# Patient Record
Sex: Male | Born: 1980 | Race: White | Hispanic: No | Marital: Single | Smoking: Heavy tobacco smoker
Health system: Southern US, Community
[De-identification: ages and names within clinical notes are randomized; demographics above are authoritative.]

## PROBLEM LIST (undated history)

## (undated) DIAGNOSIS — R011 Cardiac murmur, unspecified: Secondary | ICD-10-CM

## (undated) DIAGNOSIS — N200 Calculus of kidney: Secondary | ICD-10-CM

## (undated) HISTORY — DX: Calculus of kidney: N20.0

---

## 2004-12-07 ENCOUNTER — Emergency Department: Payer: Self-pay | Admitting: Emergency Medicine

## 2008-01-17 ENCOUNTER — Emergency Department: Payer: Self-pay | Admitting: Emergency Medicine

## 2009-01-29 ENCOUNTER — Emergency Department: Payer: Self-pay | Admitting: Emergency Medicine

## 2009-04-14 ENCOUNTER — Emergency Department: Payer: Self-pay | Admitting: Unknown Physician Specialty

## 2009-10-09 ENCOUNTER — Emergency Department: Payer: Self-pay | Admitting: Emergency Medicine

## 2010-07-18 ENCOUNTER — Emergency Department (HOSPITAL_COMMUNITY)
Admission: EM | Admit: 2010-07-18 | Discharge: 2010-07-18 | Payer: Self-pay | Source: Home / Self Care | Admitting: Emergency Medicine

## 2010-08-16 ENCOUNTER — Emergency Department: Payer: Self-pay | Admitting: Emergency Medicine

## 2010-08-17 ENCOUNTER — Emergency Department (HOSPITAL_COMMUNITY)
Admission: EM | Admit: 2010-08-17 | Discharge: 2010-08-17 | Disposition: A | Discharge status: Home / Self Care | Payer: Self-pay | Attending: Emergency Medicine | Admitting: Emergency Medicine

## 2010-08-17 DIAGNOSIS — K029 Dental caries, unspecified: Secondary | ICD-10-CM | POA: Insufficient documentation

## 2010-08-17 DIAGNOSIS — F17200 Nicotine dependence, unspecified, uncomplicated: Secondary | ICD-10-CM | POA: Insufficient documentation

## 2010-09-28 ENCOUNTER — Emergency Department (HOSPITAL_COMMUNITY)
Admission: EM | Admit: 2010-09-28 | Discharge: 2010-09-29 | Disposition: A | Discharge status: Home / Self Care | Payer: Self-pay | Attending: Emergency Medicine | Admitting: Emergency Medicine

## 2010-09-28 DIAGNOSIS — K089 Disorder of teeth and supporting structures, unspecified: Secondary | ICD-10-CM | POA: Insufficient documentation

## 2010-09-28 DIAGNOSIS — X58XXXA Exposure to other specified factors, initial encounter: Secondary | ICD-10-CM | POA: Insufficient documentation

## 2010-09-28 DIAGNOSIS — K047 Periapical abscess without sinus: Secondary | ICD-10-CM | POA: Insufficient documentation

## 2010-09-28 DIAGNOSIS — S025XXA Fracture of tooth (traumatic), initial encounter for closed fracture: Secondary | ICD-10-CM | POA: Insufficient documentation

## 2010-10-29 ENCOUNTER — Emergency Department (HOSPITAL_COMMUNITY)
Admission: EM | Admit: 2010-10-29 | Discharge: 2010-10-29 | Disposition: A | Discharge status: Home / Self Care | Payer: Self-pay | Attending: Emergency Medicine | Admitting: Emergency Medicine

## 2010-10-29 ENCOUNTER — Emergency Department (HOSPITAL_COMMUNITY): Payer: Self-pay

## 2010-10-29 DIAGNOSIS — M25519 Pain in unspecified shoulder: Secondary | ICD-10-CM | POA: Insufficient documentation

## 2010-10-29 DIAGNOSIS — IMO0002 Reserved for concepts with insufficient information to code with codable children: Secondary | ICD-10-CM | POA: Insufficient documentation

## 2010-10-29 DIAGNOSIS — S46909A Unspecified injury of unspecified muscle, fascia and tendon at shoulder and upper arm level, unspecified arm, initial encounter: Secondary | ICD-10-CM | POA: Insufficient documentation

## 2010-10-29 DIAGNOSIS — M25619 Stiffness of unspecified shoulder, not elsewhere classified: Secondary | ICD-10-CM | POA: Insufficient documentation

## 2010-10-29 DIAGNOSIS — W11XXXA Fall on and from ladder, initial encounter: Secondary | ICD-10-CM | POA: Insufficient documentation

## 2010-10-29 DIAGNOSIS — M545 Low back pain, unspecified: Secondary | ICD-10-CM | POA: Insufficient documentation

## 2010-10-29 DIAGNOSIS — S4980XA Other specified injuries of shoulder and upper arm, unspecified arm, initial encounter: Secondary | ICD-10-CM | POA: Insufficient documentation

## 2011-02-27 ENCOUNTER — Emergency Department: Payer: Self-pay | Admitting: Emergency Medicine

## 2011-03-31 ENCOUNTER — Emergency Department: Payer: Self-pay | Admitting: Emergency Medicine

## 2011-04-02 ENCOUNTER — Emergency Department: Payer: Self-pay | Admitting: *Deleted

## 2011-04-17 ENCOUNTER — Emergency Department: Payer: Self-pay | Admitting: Emergency Medicine

## 2011-04-24 ENCOUNTER — Emergency Department (HOSPITAL_COMMUNITY)
Admission: EM | Admit: 2011-04-24 | Discharge: 2011-04-24 | Disposition: A | Discharge status: Home / Self Care | Payer: Self-pay | Attending: Emergency Medicine | Admitting: Emergency Medicine

## 2011-04-24 DIAGNOSIS — K047 Periapical abscess without sinus: Secondary | ICD-10-CM | POA: Insufficient documentation

## 2011-04-24 DIAGNOSIS — K089 Disorder of teeth and supporting structures, unspecified: Secondary | ICD-10-CM | POA: Insufficient documentation

## 2011-04-24 DIAGNOSIS — R221 Localized swelling, mass and lump, neck: Secondary | ICD-10-CM | POA: Insufficient documentation

## 2011-04-24 DIAGNOSIS — R220 Localized swelling, mass and lump, head: Secondary | ICD-10-CM | POA: Insufficient documentation

## 2011-04-24 DIAGNOSIS — R51 Headache: Secondary | ICD-10-CM | POA: Insufficient documentation

## 2011-04-24 DIAGNOSIS — K0381 Cracked tooth: Secondary | ICD-10-CM | POA: Insufficient documentation

## 2012-04-22 ENCOUNTER — Emergency Department: Payer: Self-pay | Admitting: Emergency Medicine

## 2012-07-05 ENCOUNTER — Emergency Department (HOSPITAL_COMMUNITY)
Admission: EM | Admit: 2012-07-05 | Discharge: 2012-07-05 | Disposition: A | Discharge status: Home / Self Care | Payer: Self-pay | Attending: Emergency Medicine | Admitting: Emergency Medicine

## 2012-07-05 ENCOUNTER — Encounter (HOSPITAL_COMMUNITY): Payer: Self-pay | Admitting: Emergency Medicine

## 2012-07-05 DIAGNOSIS — S025XXA Fracture of tooth (traumatic), initial encounter for closed fracture: Secondary | ICD-10-CM | POA: Insufficient documentation

## 2012-07-05 DIAGNOSIS — Y929 Unspecified place or not applicable: Secondary | ICD-10-CM | POA: Insufficient documentation

## 2012-07-05 DIAGNOSIS — X58XXXA Exposure to other specified factors, initial encounter: Secondary | ICD-10-CM | POA: Insufficient documentation

## 2012-07-05 DIAGNOSIS — F17200 Nicotine dependence, unspecified, uncomplicated: Secondary | ICD-10-CM | POA: Insufficient documentation

## 2012-07-05 DIAGNOSIS — Y9389 Activity, other specified: Secondary | ICD-10-CM | POA: Insufficient documentation

## 2012-07-05 MED ORDER — AMOXICILLIN 500 MG PO CAPS
1000.0000 mg | ORAL_CAPSULE | Freq: Once | ORAL | Status: AC
  Administered 2012-07-05: 1000 mg via ORAL
  Filled 2012-07-05: qty 1

## 2012-07-05 MED ORDER — OXYCODONE-ACETAMINOPHEN 5-325 MG PO TABS
2.0000 | ORAL_TABLET | Freq: Once | ORAL | Status: AC
  Administered 2012-07-05: 2 via ORAL
  Filled 2012-07-05: qty 2

## 2012-07-05 MED ORDER — AMOXICILLIN 500 MG PO CAPS: 500.0000 mg | ORAL_CAPSULE | Freq: Three times a day (TID) | ORAL | Status: DC

## 2012-07-05 MED ORDER — OXYCODONE-ACETAMINOPHEN 5-325 MG PO TABS: 2.0000 | ORAL_TABLET | ORAL | Status: DC | PRN

## 2012-07-05 NOTE — ED Notes (Signed)
PT. REPORTS LEFT LOWER MOLAR PAIN ONSET YESTERDAY UNRELIEVED BY OTC TYLENOL / IBUPROFEN.

## 2012-07-05 NOTE — ED Provider Notes (Signed)
History   This chart was scribed for Donnetta Hutching, MD by Sofie Rower, ED Scribe. The patient was seen in room TR08C/TR08C and the patient's care was started at 8:03PM.    CSN: [id number]  Arrival date & time 07/05/12  4098   First MD Initiated Contact with Patient 07/05/12 2003      Chief Complaint  Patient presents with  . Dental Pain    (Consider location/radiation/quality/duration/timing/severity/associated sxs/prior treatment) The history is provided by the patient. No language interpreter was used.    Patrick Higgins is a 31 y.o. male , who presents to the Emergency Department complaining of sudden, progressively worsening, dental pain located at the lower left jaw, onset yesterday (07/04/12). The pt reports he was chewing some food yesterday, 07/04/12, where he believes a piece of one of his lower left molars chipped off. The pt has not taken any medications to relieve his dental pain at present.   The pt is a heavy tobacco smoker, however, he does not drink alcohol.   History reviewed. No pertinent past medical history.  History reviewed. No pertinent past surgical history.  No family history on file.  History  Substance Use Topics  . Smoking status: Heavy Tobacco Smoker  . Smokeless tobacco: Not on file  . Alcohol Use: No      Review of Systems  HENT: Positive for dental problem.   All other systems reviewed and are negative.    Allergies  Review of patient's allergies indicates no known allergies.  Home Medications  No current outpatient prescriptions on file.  BP 144/2  Pulse 95  Temp 98.4 F (36.9 C) (Oral)  Resp 14  SpO2 100%  Physical Exam  Nursing note and vitals reviewed. Constitutional: He is oriented to person, place, and time. He appears well-developed and well-nourished.  HENT:  Head: Atraumatic.  Nose: Nose normal.       Left lower premolar: fractured tooth present.   Musculoskeletal: Normal range of motion.  Neurological: He is alert  and oriented to person, place, and time.  Skin: Skin is warm and dry.  Psychiatric: He has a normal mood and affect.    ED Course  Procedures (including critical care time)  DIAGNOSTIC STUDIES: Oxygen Saturation is 100% on room air, normal by my interpretation.    COORDINATION OF CARE:   8:24 PM- Treatment plan discussed with patient. Pt agrees with treatment.     Labs Reviewed - No data to display No results found.   No diagnosis found.    MDM   Rx amoxicillin and Percocet. Referral to Dentist     I personally performed the services described in this documentation, which was scribed in my presence. The recorded information has been reviewed and is accurate.    Donnetta Hutching, MD 07/05/12 2043

## 2012-08-03 ENCOUNTER — Emergency Department (HOSPITAL_COMMUNITY)
Admission: EM | Admit: 2012-08-03 | Discharge: 2012-08-03 | Disposition: A | Discharge status: Home / Self Care | Payer: Self-pay | Attending: Emergency Medicine | Admitting: Emergency Medicine

## 2012-08-03 ENCOUNTER — Encounter (HOSPITAL_COMMUNITY): Payer: Self-pay | Admitting: Cardiology

## 2012-08-03 DIAGNOSIS — K029 Dental caries, unspecified: Secondary | ICD-10-CM | POA: Insufficient documentation

## 2012-08-03 DIAGNOSIS — K089 Disorder of teeth and supporting structures, unspecified: Secondary | ICD-10-CM | POA: Insufficient documentation

## 2012-08-03 DIAGNOSIS — F17200 Nicotine dependence, unspecified, uncomplicated: Secondary | ICD-10-CM | POA: Insufficient documentation

## 2012-08-03 MED ORDER — PENICILLIN V POTASSIUM 500 MG PO TABS: 500.0000 mg | ORAL_TABLET | Freq: Three times a day (TID) | ORAL | Status: DC

## 2012-08-03 MED ORDER — OXYCODONE-ACETAMINOPHEN 5-325 MG PO TABS: 1.0000 | ORAL_TABLET | Freq: Four times a day (QID) | ORAL | Status: DC | PRN

## 2012-08-03 NOTE — ED Provider Notes (Signed)
Medical screening examination/treatment/procedure(s) were performed by non-physician practitioner and as supervising physician I was immediately available for consultation/collaboration.   Christopher J. Pollina, MD 08/03/12 2314 

## 2012-08-03 NOTE — ED Provider Notes (Signed)
History   This chart was scribed for non-physician practitioner working with Patrick Higgins,  by Gerlean Ren, ED Scribe. This patient was seen in room TR05C/TR05C and the patient's care was started at 4:48 PM.    CSN: [id number]  Arrival date & time 08/03/12  1452   None     Chief Complaint  Patient presents with  . Dental Pain     The history is provided by the patient. No language interpreter was used.   Patrick Higgins is a 32 y.o. male who presents to the Emergency Department complaining of constant, gradually worsening, sharp, moderate right side lower dental pain radiating into right mandible with gradual onset 2 days ago.  Pt denies any trauma associated with pain.  Pt states he has an appointment with dentist Dr. Thayer Ohm 02/25 in [place].  Pt states he was on amoxicillin 2 weeks ago for a chipped tooth on the left side but that he has completed the antibiotic regiment.  Pt is a current everyday smoker but denies alcohol use.  History reviewed. No pertinent past medical history.  History reviewed. No pertinent past surgical history.  History reviewed. No pertinent family history.  History  Substance Use Topics  . Smoking status: Heavy Tobacco Smoker  . Smokeless tobacco: Not on file  . Alcohol Use: No      Review of Systems  HENT: Positive for dental problem.   All other systems reviewed and are negative.    Allergies  Review of patient's allergies indicates no known allergies.  Home Medications   Current Outpatient Rx  Name  Route  Sig  Dispense  Refill  . IBUPROFEN 200 MG PO TABS   Oral   Take 400 mg by mouth every 2 (two) hours as needed. For pain           BP 133/78  Pulse 79  Temp 97 F (36.1 C) (Oral)  Resp 16  SpO2 97%  Physical Exam  Nursing note and vitals reviewed. Constitutional: He is oriented to person, place, and time. He appears well-developed and well-nourished. No distress.  HENT:  Head: Normocephalic and atraumatic.     Widespread dental decay with a carrie on tooth #30.  Swelling in right mandible  Eyes: EOM are normal.  Neck: Neck supple. No tracheal deviation present.  Cardiovascular: Normal rate, regular rhythm and normal heart sounds.   No murmur heard. Pulmonary/Chest: Effort normal and breath sounds normal. No respiratory distress. He has no wheezes.  Musculoskeletal: Normal range of motion.  Lymphadenopathy:    He has no cervical adenopathy.  Neurological: He is alert and oriented to person, place, and time.  Skin: Skin is warm and dry.  Psychiatric: He has a normal mood and affect. His behavior is normal.    ED Course  Procedures (including critical care time) DIAGNOSTIC STUDIES: Oxygen Saturation is 97% on room air, adequate by my interpretation.    COORDINATION OF CARE: 4:51 PM- Discussed with pt that he needs to keep his dental appointment.  Discussed antibiotics, pain medication, and discharge.  Pt understands and agrees with plan.  No diagnosis found.  Dental pain/caries.  Patient states he has an appointment with a dentist in [place] next month.  MDM     I personally performed the services described in this documentation, which was scribed in my presence. The recorded information has been reviewed and is accurate.       Jimmye Norman, NP 08/03/12 1751

## 2012-08-03 NOTE — ED Notes (Signed)
Pt reports pain on the left lower jaw pain and is now having pain on the right side. States that he has a appt on the 25th but is having increased pain now.

## 2013-09-15 ENCOUNTER — Emergency Department: Payer: Self-pay | Admitting: Emergency Medicine

## 2013-11-19 ENCOUNTER — Emergency Department: Payer: Self-pay | Admitting: Emergency Medicine

## 2013-11-23 LAB — WOUND CULTURE

## 2014-05-14 ENCOUNTER — Emergency Department: Payer: Self-pay | Admitting: Emergency Medicine

## 2014-05-14 LAB — URINALYSIS, COMPLETE
BACTERIA: NONE SEEN
BILIRUBIN, UR: NEGATIVE
BLOOD: NEGATIVE
GLUCOSE, UR: NEGATIVE mg/dL (ref 0–75)
Ketone: NEGATIVE
LEUKOCYTE ESTERASE: NEGATIVE
Nitrite: NEGATIVE
Ph: 7 (ref 4.5–8.0)
Protein: NEGATIVE
SPECIFIC GRAVITY: 1.015 (ref 1.003–1.030)
SQUAMOUS EPITHELIAL: NONE SEEN
WBC UR: 15 /HPF (ref 0–5)

## 2014-05-14 LAB — COMPREHENSIVE METABOLIC PANEL
ALK PHOS: 104 U/L
ALT: 20 U/L
ANION GAP: 5 — AB (ref 7–16)
Albumin: 3.8 g/dL (ref 3.4–5.0)
BUN: 12 mg/dL (ref 7–18)
Bilirubin,Total: 0.4 mg/dL (ref 0.2–1.0)
CALCIUM: 8.4 mg/dL — AB (ref 8.5–10.1)
CO2: 29 mmol/L (ref 21–32)
Chloride: 106 mmol/L (ref 98–107)
Creatinine: 0.92 mg/dL (ref 0.60–1.30)
GLUCOSE: 86 mg/dL (ref 65–99)
Osmolality: 278 (ref 275–301)
POTASSIUM: 3.7 mmol/L (ref 3.5–5.1)
SGOT(AST): 16 U/L (ref 15–37)
SODIUM: 140 mmol/L (ref 136–145)
TOTAL PROTEIN: 7.5 g/dL (ref 6.4–8.2)

## 2014-05-14 LAB — CBC
HCT: 42 % (ref 40.0–52.0)
HGB: 14.3 g/dL (ref 13.0–18.0)
MCH: 27.2 pg (ref 26.0–34.0)
MCHC: 33.9 g/dL (ref 32.0–36.0)
MCV: 80 fL (ref 80–100)
Platelet: 192 10*3/uL (ref 150–440)
RBC: 5.24 10*6/uL (ref 4.40–5.90)
RDW: 12.8 % (ref 11.5–14.5)
WBC: 6.7 10*3/uL (ref 3.8–10.6)

## 2014-05-20 ENCOUNTER — Emergency Department: Payer: Self-pay | Admitting: Emergency Medicine

## 2014-08-29 ENCOUNTER — Emergency Department: Payer: Self-pay | Admitting: Student

## 2015-02-19 ENCOUNTER — Emergency Department: Payer: Self-pay

## 2015-02-19 ENCOUNTER — Encounter: Payer: Self-pay | Admitting: Emergency Medicine

## 2015-02-19 ENCOUNTER — Emergency Department
Admission: EM | Admit: 2015-02-19 | Discharge: 2015-02-19 | Disposition: A | Discharge status: Home / Self Care | Payer: Self-pay | Attending: Emergency Medicine | Admitting: Emergency Medicine

## 2015-02-19 DIAGNOSIS — Y9289 Other specified places as the place of occurrence of the external cause: Secondary | ICD-10-CM | POA: Insufficient documentation

## 2015-02-19 DIAGNOSIS — Y998 Other external cause status: Secondary | ICD-10-CM | POA: Insufficient documentation

## 2015-02-19 DIAGNOSIS — Y288XXA Contact with other sharp object, undetermined intent, initial encounter: Secondary | ICD-10-CM | POA: Insufficient documentation

## 2015-02-19 DIAGNOSIS — S61412A Laceration without foreign body of left hand, initial encounter: Secondary | ICD-10-CM

## 2015-02-19 DIAGNOSIS — Z720 Tobacco use: Secondary | ICD-10-CM | POA: Insufficient documentation

## 2015-02-19 DIAGNOSIS — Y9389 Activity, other specified: Secondary | ICD-10-CM | POA: Insufficient documentation

## 2015-02-19 DIAGNOSIS — S66922A Laceration of unspecified muscle, fascia and tendon at wrist and hand level, left hand, initial encounter: Secondary | ICD-10-CM | POA: Insufficient documentation

## 2015-02-19 DIAGNOSIS — Z792 Long term (current) use of antibiotics: Secondary | ICD-10-CM | POA: Insufficient documentation

## 2015-02-19 MED ORDER — OXYCODONE-ACETAMINOPHEN 7.5-325 MG PO TABS: 1.0000 | ORAL_TABLET | Freq: Four times a day (QID) | ORAL | Status: DC | PRN

## 2015-02-19 MED ORDER — BACITRACIN ZINC 500 UNIT/GM EX OINT
TOPICAL_OINTMENT | CUTANEOUS | Status: AC
  Filled 2015-02-19: qty 0.9

## 2015-02-19 MED ORDER — SULFAMETHOXAZOLE-TRIMETHOPRIM 800-160 MG PO TABS: 1.0000 | ORAL_TABLET | Freq: Two times a day (BID) | ORAL | Status: DC

## 2015-02-19 MED ORDER — LIDOCAINE HCL (PF) 1 % IJ SOLN
5.0000 mL | Freq: Once | INTRAMUSCULAR | Status: DC
Start: 2015-02-19 — End: 2015-02-19

## 2015-02-19 MED ORDER — LIDOCAINE HCL (PF) 1 % IJ SOLN
INTRAMUSCULAR | Status: AC
  Filled 2015-02-19: qty 5

## 2015-02-19 NOTE — ED Notes (Addendum)
Pt accidentally  Cut left hand hand with a chain saw.  2 cm laceration, to posterior hand, bleeding controlled, dressing applied in triage.  Small tendons visible thru laceration.  Pt able to move all fingers w/o difficulty.

## 2015-02-19 NOTE — ED Notes (Signed)
Neosporin dsy to left hand prio to splint

## 2015-02-19 NOTE — ED Provider Notes (Signed)
[facility] Emergency Department Provider Note  ____________________________________________  Time seen: Approximately 3:23 PM  I have reviewed the triage vital signs and the nursing notes.   HISTORY  Chief Complaint Laceration    HPI Patrick Higgins is a 34 y.o. male patient for laceration dorsal aspect of the third metacarpal. Injury was caused by a chain saw. Patient has a visible exposed tendon with incomplete disruption. Patient able to flex and extend the third digit. Patient denies any loss of sensation or function. Patient rates his pain as a 9/10 and described as sharp. Except for direct pressure controlled the hemorrhage and no other palliative measures performed prior to arrival. Patient state his tetanus shot is up-to-date. Patient is right-hand dominant. History reviewed. No pertinent past medical history.  There are no active problems to display for this patient.   No past surgical history on file.  Current Outpatient Rx  Name  Route  Sig  Dispense  Refill  . ibuprofen (ADVIL,MOTRIN) 200 MG tablet   Oral   Take 400 mg by mouth every 2 (two) hours as needed. For pain         . oxyCODONE-acetaminophen (PERCOCET) 7.5-325 MG per tablet   Oral   Take 1 tablet by mouth every 6 (six) hours as needed for severe pain.   12 tablet   0   . oxyCODONE-acetaminophen (PERCOCET/ROXICET) 5-325 MG per tablet   Oral   Take 1 tablet by mouth every 6 (six) hours as needed for pain.   15 tablet   0   . penicillin v potassium (VEETID) 500 MG tablet   Oral   Take 1 tablet (500 mg total) by mouth 3 (three) times daily.   30 tablet   0   . sulfamethoxazole-trimethoprim (BACTRIM DS,SEPTRA DS) 800-160 MG per tablet   Oral   Take 1 tablet by mouth 2 (two) times daily.   20 tablet   0     Allergies Hydrocodone  No family history on file.  Social History Social History  Substance Use Topics  . Smoking status: Heavy Tobacco Smoker    Types:  Cigarettes  . Smokeless tobacco: None  . Alcohol Use: No    Review of Systems Constitutional: No fever/chills Eyes: No visual changes. ENT: No sore throat. Cardiovascular: Denies chest pain. Respiratory: Denies shortness of breath. Gastrointestinal: No abdominal pain.  No nausea, no vomiting.  No diarrhea.  No constipation. Genitourinary: Negative for dysuria. Musculoskeletal: Laceration dorsal aspect the left hand. Skin: Negative for rash. Neurological: Negative for headaches, focal weakness or numbness. 10-point ROS otherwise negative.  ____________________________________________   PHYSICAL EXAM:  VITAL SIGNS: ED Triage Vitals  Enc Vitals Group     BP 02/19/15 1414 134/79 mmHg     Pulse Rate 02/19/15 1414 58     Resp 02/19/15 1414 16     Temp 02/19/15 1414 98.1 F (36.7 C)     Temp Source 02/19/15 1414 Oral     SpO2 02/19/15 1414 100 %     Weight 02/19/15 1414 190 lb (86.183 kg)     Height 02/19/15 1414 6\' 3"  (1.905 m)     Head Cir --      Peak Flow --      Pain Score 02/19/15 1415 9     Pain Loc --      Pain Edu? --      Excl. in GC? --     Constitutional: Alert and oriented. Well appearing and in no  acute distress. Eyes: Conjunctivae are normal. PERRL. EOMI. Head: Atraumatic. Nose: No congestion/rhinnorhea. Mouth/Throat: Mucous membranes are moist.  Oropharynx non-erythematous. Neck: No stridor.  No cervical spine tenderness to palpation. Hematological/Lymphatic/Immunilogical: No cervical lymphadenopathy. Cardiovascular: Normal rate, regular rhythm. Grossly normal heart sounds.  Good peripheral circulation. Respiratory: Normal respiratory effort.  No retractions. Lungs CTAB. Gastrointestinal: Soft and nontender. No distention. No abdominal bruits. No CVA tenderness. Musculoskeletal: 2 cm laceration to the dorsal aspect of the left hand over the third metacarpal. Tendon exposed and has a laceration but not complete disruption of the tendon. Patient has full  and equal range of motion at this time. States sensation is intact. Neurologic:  Normal speech and language. No gross focal neurologic deficits are appreciated. No gait instability. Skin:  2 cm laceration dorsal aspect left hand  Psychiatric: Mood and affect are normal. Speech and behavior are normal.  ____________________________________________   LABS (all labs ordered are listed, but only abnormal results are displayed)  Labs Reviewed - No data to display ____________________________________________  EKG   ____________________________________________  RADIOLOGY  X-rays grossly unremarkable.I, Joni Reining, personally viewed and evaluated these images (plain radiographs) as part of my medical decision making.   ____________________________________________   PROCEDURES  Procedure(s) performed see procedure note  Critical Care performed: No  LACERATION REPAIR Performed by: Joni Reining Authorized by: Joni Reining Consent: Verbal consent obtained. Risks and benefits: risks, benefits and alternatives were discussed Consent given by: patient Patient identity confirmed: provided demographic data Prepped and Draped in normal sterile fashion Wound explored  Laceration Location: Dose aspect the left hand at the third metacarpal  Laceration Length: 2 cm  No Foreign Bodies seen or palpated  Anesthesia: local infiltration  Local anesthetic: 1% lidocaine with epinephrine.   Anesthetic total: 4 mL.   Irrigation method: syringe Amount of cleaning: standard  Skin closure: 4-0 nylon.   Number of sutures: 6   Technique: Interrupted   Patient tolerance: Patient tolerated the procedure well with no immediate complications. _____________________________________   INITIAL IMPRESSION / ASSESSMENT AND PLAN / ED COURSE  Pertinent labs & imaging results that were available during my care of the patient were reviewed by me and considered in my medical decision making  (see chart for details).  Laceration dorsal aspect the left hand at the third medical carpal. There is a partial laceration of the tendon. There is no complete disruption of the tendon. Discussed findings with the on-call orthopedic Dr. Joice Lofts and advised copious irrigation closure and splint. Patient advised on wound care. Patient advised return ER if loss of function of the affected finger. Patient discharge with Bactrim and Percocets. Patient returned back in 10 days for suture removal.  FINAL CLINICAL IMPRESSION(S) / ED DIAGNOSES  Final diagnoses:  Laceration of left hand involving tendon, initial encounter      Joni Reining, PA-C 02/19/15 1610  Jene Every, MD 02/20/15 1409

## 2015-02-19 NOTE — Discharge Instructions (Signed)
Return to ER if loss of function of 3rd digit left hand.  Wear splint for 5-7 days.

## 2015-02-19 NOTE — ED Notes (Signed)
Pt reports got cut by chainsaw to top of left hand, no bleeding at this time. Clean bandage applied, strong pulse present.

## 2015-03-01 ENCOUNTER — Encounter: Payer: Self-pay | Admitting: Emergency Medicine

## 2015-03-01 ENCOUNTER — Emergency Department
Admission: EM | Admit: 2015-03-01 | Discharge: 2015-03-01 | Disposition: A | Discharge status: Home / Self Care | Payer: Self-pay | Attending: Emergency Medicine | Admitting: Emergency Medicine

## 2015-03-01 DIAGNOSIS — L089 Local infection of the skin and subcutaneous tissue, unspecified: Secondary | ICD-10-CM | POA: Insufficient documentation

## 2015-03-01 DIAGNOSIS — W132XXD Fall from, out of or through roof, subsequent encounter: Secondary | ICD-10-CM | POA: Insufficient documentation

## 2015-03-01 DIAGNOSIS — S61412D Laceration without foreign body of left hand, subsequent encounter: Secondary | ICD-10-CM | POA: Insufficient documentation

## 2015-03-01 DIAGNOSIS — Z720 Tobacco use: Secondary | ICD-10-CM | POA: Insufficient documentation

## 2015-03-01 LAB — CBC WITH DIFFERENTIAL/PLATELET
BASOS ABS: 0 10*3/uL (ref 0–0.1)
BASOS PCT: 0 %
EOS PCT: 1 %
Eosinophils Absolute: 0.1 10*3/uL (ref 0–0.7)
HEMATOCRIT: 38.2 % — AB (ref 40.0–52.0)
Hemoglobin: 12.9 g/dL — ABNORMAL LOW (ref 13.0–18.0)
LYMPHS PCT: 14 %
Lymphs Abs: 1.2 10*3/uL (ref 1.0–3.6)
MCH: 26.5 pg (ref 26.0–34.0)
MCHC: 33.9 g/dL (ref 32.0–36.0)
MCV: 78.4 fL — ABNORMAL LOW (ref 80.0–100.0)
MONO ABS: 0.3 10*3/uL (ref 0.2–1.0)
Monocytes Relative: 4 %
NEUTROS ABS: 6.8 10*3/uL — AB (ref 1.4–6.5)
Neutrophils Relative %: 81 %
PLATELETS: 164 10*3/uL (ref 150–440)
RBC: 4.87 MIL/uL (ref 4.40–5.90)
RDW: 12.6 % (ref 11.5–14.5)
WBC: 8.5 10*3/uL (ref 3.8–10.6)

## 2015-03-01 MED ORDER — OXYCODONE-ACETAMINOPHEN 5-325 MG PO TABS
1.0000 | ORAL_TABLET | Freq: Once | ORAL | Status: AC
  Administered 2015-03-01: 1 via ORAL
  Filled 2015-03-01: qty 1

## 2015-03-01 MED ORDER — OXYCODONE-ACETAMINOPHEN 5-325 MG PO TABS: 1.0000 | ORAL_TABLET | ORAL | Status: DC | PRN

## 2015-03-01 MED ORDER — CEPHALEXIN 500 MG PO CAPS: 500.0000 mg | ORAL_CAPSULE | Freq: Four times a day (QID) | ORAL | Status: DC

## 2015-03-01 NOTE — ED Provider Notes (Signed)
[facility] Emergency Department Provider Note  ____________________________________________  Time seen: Approximately 1:57 PM  I have reviewed the triage vital signs and the nursing notes.   HISTORY  Chief Complaint Hand Pain  HPI Patrick Higgins is a 34 y.o. male is here today with complaint of left hand swollen and hurting. He was seen in the emergency room 8/15 after a laceration with a chain saw to his left hand. Patient states until now he has had no difficulty with his hand. He denies any fever or chills. He denies seeing any drainage from his hand. He states the redness to the dorsum of his left hand has not been there prior to today.Currently he rates his pain an 8 out of 10.   History reviewed. No pertinent past medical history.  There are no active problems to display for this patient.   History reviewed. No pertinent past surgical history.  Current Outpatient Rx  Name  Route  Sig  Dispense  Refill  . cephALEXin (KEFLEX) 500 MG capsule   Oral   Take 1 capsule (500 mg total) by mouth 4 (four) times daily.   28 capsule   0   . ibuprofen (ADVIL,MOTRIN) 200 MG tablet   Oral   Take 400 mg by mouth every 2 (two) hours as needed. For pain         . oxyCODONE-acetaminophen (PERCOCET) 5-325 MG per tablet   Oral   Take 1 tablet by mouth every 4 (four) hours as needed for severe pain.   20 tablet   0     Allergies Hydrocodone  No family history on file.  Social History Social History  Substance Use Topics  . Smoking status: Heavy Tobacco Smoker -- 1.00 packs/day    Types: Cigarettes  . Smokeless tobacco: None  . Alcohol Use: No    Review of Systems Constitutional: No fever/chills Cardiovascular: Denies chest pain. Respiratory: Denies shortness of breath. Gastrointestinal:  No nausea, no vomiting.  Musculoskeletal: Negative for back pain. Skin: Negative for rash. Positive for erythema left hand Neurological: Negative for  headaches, focal weakness or numbness.  10-point ROS otherwise negative.  ____________________________________________   PHYSICAL EXAM:  VITAL SIGNS: ED Triage Vitals  Enc Vitals Group     BP 03/01/15 1324 139/70 mmHg     Pulse Rate 03/01/15 1324 64     Resp 03/01/15 1324 18     Temp 03/01/15 1324 98.2 F (36.8 C)     Temp Source 03/01/15 1324 Oral     SpO2 03/01/15 1324 98 %     Weight 03/01/15 1321 190 lb (86.183 kg)     Height 03/01/15 1321  (1.854 m)     Head Cir --      Peak Flow --      Pain Score 03/01/15 1322 8     Pain Loc --      Pain Edu? --      Excl. in GC? --     Constitutional: Alert and oriented. Well appearing and in no acute distress. Eyes: Conjunctivae are normal. PERRL. EOMI. Head: Atraumatic. Nose: No congestion/rhinnorhea. Neck: No stridor.   Respiratory: Normal respiratory effort.  No retractions. Gastrointestinal: . No distention. Musculoskeletal left hand range of motion without restriction. Neurologic:  Normal speech and language. No gross focal neurologic deficits are appreciated. No gait instability. Skin:  Skin is warm, dry.  Dorsum of left hand has been sutured. Area appears to be somewhat red and moderately tender  on touch. There is no drainage noted at this time and sutures are in place. Psychiatric: Mood and affect are normal. Speech and behavior are normal.  ____________________________________________   LABS (all labs ordered are listed, but only abnormal results are displayed)  Labs Reviewed  CBC WITH DIFFERENTIAL/PLATELET - Abnormal; Notable for the following:    Hemoglobin 12.9 (*)    HCT 38.2 (*)    MCV 78.4 (*)    Neutro Abs 6.8 (*)    All other components within normal limits    PROCEDURES  Procedure(s) performed: None  Critical Care performed: No  ____________________________________________   INITIAL IMPRESSION / ASSESSMENT AND PLAN / ED COURSE  Pertinent labs & imaging results that were available during  my care of the patient were reviewed by me and considered in my medical decision making (see chart for details).  White count is within normal limits. Patient was placed on Keflex 500 mg 4 times a day for 7 days and Percocet as needed for pain. Patient will return to the emergency room in 3 days for removal of the sutures. He is return to the emergency room sooner if any fever, chills, drainage or severe worsening of his hand infection. ____________________________________________   FINAL CLINICAL IMPRESSION(S) / ED DIAGNOSES  Final diagnoses:  Laceration of hand with infection, left, subsequent encounter      Tommi Rumps, PA-C 03/01/15 1442  Jene Every, MD 03/01/15 [phone]

## 2015-03-01 NOTE — ED Notes (Signed)
Pt was seen in the ER last Mon after cutting his left hand with chain saw, was stiched up and told to return tomorrow to get stitches removed. Pt states he woke up with a swollen left hand and hurting. Obvious swelling noted.

## 2016-01-31 ENCOUNTER — Emergency Department
Admission: EM | Admit: 2016-01-31 | Discharge: 2016-01-31 | Disposition: A | Discharge status: Home / Self Care | Payer: Self-pay | Attending: Emergency Medicine | Admitting: Emergency Medicine

## 2016-01-31 DIAGNOSIS — K047 Periapical abscess without sinus: Secondary | ICD-10-CM | POA: Insufficient documentation

## 2016-01-31 DIAGNOSIS — F17210 Nicotine dependence, cigarettes, uncomplicated: Secondary | ICD-10-CM | POA: Insufficient documentation

## 2016-01-31 DIAGNOSIS — Y939 Activity, unspecified: Secondary | ICD-10-CM | POA: Insufficient documentation

## 2016-01-31 DIAGNOSIS — K0889 Other specified disorders of teeth and supporting structures: Secondary | ICD-10-CM

## 2016-01-31 DIAGNOSIS — Y999 Unspecified external cause status: Secondary | ICD-10-CM | POA: Insufficient documentation

## 2016-01-31 DIAGNOSIS — X58XXXA Exposure to other specified factors, initial encounter: Secondary | ICD-10-CM | POA: Insufficient documentation

## 2016-01-31 DIAGNOSIS — Y929 Unspecified place or not applicable: Secondary | ICD-10-CM | POA: Insufficient documentation

## 2016-01-31 DIAGNOSIS — S025XXA Fracture of tooth (traumatic), initial encounter for closed fracture: Secondary | ICD-10-CM | POA: Insufficient documentation

## 2016-01-31 MED ORDER — AMOXICILLIN 500 MG PO TABS: 500.0000 mg | ORAL_TABLET | Freq: Three times a day (TID) | ORAL | 0 refills | Status: DC

## 2016-01-31 MED ORDER — DEXAMETHASONE 2 MG PO TABS: ORAL_TABLET | ORAL | 0 refills | Status: DC

## 2016-01-31 MED ORDER — TRAMADOL HCL 50 MG PO TABS: 50.0000 mg | ORAL_TABLET | Freq: Four times a day (QID) | ORAL | 0 refills | Status: DC | PRN

## 2016-01-31 MED ORDER — LIDOCAINE VISCOUS 2 % MT SOLN: 10.0000 mL | OROMUCOSAL | 0 refills | Status: DC | PRN

## 2016-01-31 NOTE — ED Notes (Signed)
Pt reports left sided mouth pain that started yesterday

## 2016-01-31 NOTE — ED Provider Notes (Signed)
[facility] Emergency Department Provider Note  ____________________________________________  Time seen: Approximately 1:44 PM  I have reviewed the triage vital signs and the nursing notes.   HISTORY  Chief Complaint Dental Pain    HPI Patrick Higgins is a 35 y.o. male, NAD, presents to the emergency department with one-day history of dental pain and facial swelling. Patient states that pain of his left incisor started yesterday morning accompanied with minor swelling about the gumline. Claims he took 2 pills of leftover amoxicillin and ibuprofen yesterday. When he woke this morning, the swelling of the left side of his face was significantly increased to the point it was starting to "shut his eye". He took one more pill of amoxicillin and used a hot compress to reduce the swelling. He claims the pain is 8-9/10 and it is radiating up to behind the inferior area of his left eye as well as the left ear. Currently a one pack/day cigarette smoker. Denies vision changes, loss of vision, pain with eye movement, headache, fever, chills, body aches, difficulty eating or swallowing, shortness of breath, nor chest pain.    History reviewed. No pertinent past medical history.  There are no active problems to display for this patient.   History reviewed. No pertinent surgical history.  Current Outpatient Rx  . Order #: [id number] Class: Print  . Order #: [id number] Class: Print  . Order #: [id number] Class: Print  . Order #: [id number] Class: Historical Med  . Order #: [id number] Class: Print  . Order #: [id number] Class: Print  . Order #: [id number] Class: Print    Allergies Hydrocodone  No family history on file.  Social History Social History  Substance Use Topics  . Smoking status: Heavy Tobacco Smoker    Packs/day: 1.00    Types: Cigarettes  . Smokeless tobacco: Never Used  . Alcohol use No     Review of Systems  Constitutional: No fever/chills, Fatigue Head: Positive  swelling left cheek Eyes: Positive pain behind left eye. No visual changes nor loss of vision. No discharge, redness, globe pain  ENT: Positive left upper incisor pain radiating to left ear. No sore throat, difficulty swallowing, difficulty chewing Cardiovascular: No chest pain, palpitations. Respiratory: No cough. No shortness of breath. No wheezing.  Gastrointestinal: No abdominal pain.  No nausea, vomiting. Musculoskeletal: Negative for general myalgias, jaw pain, neck pain.  Skin: Negative for rash, skin sores, open wounds, oozing, weeping.  Neurological: Negative for headaches, focal weakness or numbness. No tingling. 10-point ROS otherwise negative.  ____________________________________________   PHYSICAL EXAM:  VITAL SIGNS: ED Triage Vitals  Enc Vitals Group     BP 01/31/16 1303 (!) 133/93     Pulse Rate 01/31/16 1303 82     Resp 01/31/16 1303 18     Temp 01/31/16 1303 98.1 F (36.7 C)     Temp Source 01/31/16 1303 Oral     SpO2 01/31/16 1303 100 %     Weight 01/31/16 1303 195 lb (88.5 kg)     Height 01/31/16 1303 6\' 3"  (1.905 m)     Head Circumference --      Peak Flow --      Pain Score 01/31/16 1304 9     Pain Loc --      Pain Edu? --      Excl. in GC? --     Constitutional: Alert and oriented. Well appearing and in no acute distress. Eyes: Conjunctivae are normal without icterus or injection. PERRLA. EOMI without pain. No proptosis. No  tenderness to palpation of bilateral globes.  Head: Soft tissue swelling about the left cheek with mild edema below the left eye. No pain to palpation of the mandible nor zygomatic process. Atraumatic. ENT:      Ears: No mastoid tenderness. No tenderness to palpation of the tragus nor pinna.      Nose: No congestion/rhinnorhea.      Mouth/Throat: Multiple dental caries. Poor dentition throughout. Fracture of the left upper incisor is noted with surrounding gumline erythema. Mucous membranes are moist.  Neck: No stridor. No carotid  bruits. Supple with full range of motion. Hematological/Lymphatic/Immunilogical: No cervical lymphadenopathy. Cardiovascular: Normal rate, regular rhythm. Normal S1 and S2.  Good peripheral circulation. Respiratory: Normal respiratory effort without tachypnea or retractions. Lungs CTAB with breath sounds noted in all lung fields. Musculoskeletal: No tenderness to palpation of the TMJ. No popping or locking of the TMJ with opening and closing of the mouth. Neurologic:  Normal speech and language. No gross focal neurologic deficits are appreciated.  Skin:  Skin is warm, dry and intact. No rash on the skin sores, open wounds, oozing, weeping. Psychiatric: Mood and affect are normal. Speech and behavior are normal. Patient exhibits appropriate insight and judgement.   ____________________________________________   LABS  None ____________________________________________  EKG  None ____________________________________________  RADIOLOGY  None ____________________________________________    PROCEDURES  Procedure(s) performed: None    Medications - No data to display   ____________________________________________   INITIAL IMPRESSION / ASSESSMENT AND PLAN / ED COURSE  Pertinent labs & imaging results that were available during my care of the patient were reviewed by me and considered in my medical decision making (see chart for details).  Patient's diagnosis is consistent with dental abscess and broken tooth causing dental pain. Patient will be discharged home with prescriptions for amoxicillin, Decadron Dosepak, lidocaine and Ultram to take as directed. Patient is to follow up with the dental clinic at the [facility] tomorrow for further evaluation and treatment. Patient was given a work note to excuse him from work today and tomorrow so that he can seek dental care. Patient is given ED precautions to return to the ED for any worsening or new symptoms.     ____________________________________________  FINAL CLINICAL IMPRESSION(S) / ED DIAGNOSES  Final diagnoses:  Dental abscess  Broken tooth, closed, initial encounter  Pain, dental      NEW MEDICATIONS STARTED DURING THIS VISIT:  Discharge Medication List as of 01/31/2016  2:23 PM    START taking these medications   Details  amoxicillin (AMOXIL) 500 MG tablet Take 1 tablet (500 mg total) by mouth 3 (three) times daily with meals., Starting Thu 01/31/2016, Print    dexamethasone (DECADRON) 2 MG tablet Take 6 tablets on Day 1 with food, then decrease by 1 tablet daily until finished (6,5,4,3,2,1), Print    lidocaine (XYLOCAINE) 2 % solution Use as directed 10 mLs in the mouth or throat every 4 (four) hours as needed for mouth pain., Starting Thu 01/31/2016, Print    traMADol (ULTRAM) 50 MG tablet Take 1 tablet (50 mg total) by mouth every 6 (six) hours as needed., Starting Thu 01/31/2016, Print             Hope Pigeon, PA-C 01/31/16 1559    Myrna Blazer, MD 02/01/16 [phone]

## 2016-01-31 NOTE — ED Triage Notes (Signed)
Pt c/o left upper tooth ache with facial swelling that started yesterday

## 2016-06-11 ENCOUNTER — Emergency Department
Admission: EM | Admit: 2016-06-11 | Discharge: 2016-06-11 | Disposition: A | Discharge status: Home / Self Care | Payer: Self-pay | Attending: Emergency Medicine | Admitting: Emergency Medicine

## 2016-06-11 ENCOUNTER — Encounter: Payer: Self-pay | Admitting: Emergency Medicine

## 2016-06-11 DIAGNOSIS — F17210 Nicotine dependence, cigarettes, uncomplicated: Secondary | ICD-10-CM | POA: Insufficient documentation

## 2016-06-11 DIAGNOSIS — Z79899 Other long term (current) drug therapy: Secondary | ICD-10-CM | POA: Insufficient documentation

## 2016-06-11 DIAGNOSIS — Z791 Long term (current) use of non-steroidal anti-inflammatories (NSAID): Secondary | ICD-10-CM | POA: Insufficient documentation

## 2016-06-11 DIAGNOSIS — K029 Dental caries, unspecified: Secondary | ICD-10-CM

## 2016-06-11 MED ORDER — OXYCODONE-ACETAMINOPHEN 5-325 MG PO TABS
1.0000 | ORAL_TABLET | Freq: Once | ORAL | Status: AC
  Administered 2016-06-11: 1 via ORAL
  Filled 2016-06-11: qty 1

## 2016-06-11 MED ORDER — KETOROLAC TROMETHAMINE 10 MG PO TABS
10.0000 mg | ORAL_TABLET | Freq: Once | ORAL | Status: DC
  Filled 2016-06-11: qty 1

## 2016-06-11 MED ORDER — CLINDAMYCIN HCL 300 MG PO CAPS: 300.0000 mg | ORAL_CAPSULE | Freq: Three times a day (TID) | ORAL | 0 refills | Status: AC

## 2016-06-11 MED ORDER — CLINDAMYCIN HCL 150 MG PO CAPS
300.0000 mg | ORAL_CAPSULE | Freq: Once | ORAL | Status: AC
  Administered 2016-06-11: 300 mg via ORAL
  Filled 2016-06-11: qty 2

## 2016-06-11 MED ORDER — LIDOCAINE VISCOUS 2 % MT SOLN
15.0000 mL | Freq: Once | OROMUCOSAL | Status: AC
  Administered 2016-06-11: 15 mL via OROMUCOSAL
  Filled 2016-06-11: qty 15

## 2016-06-11 NOTE — ED Triage Notes (Signed)
Pt presents to ED with dental pain. Onset earlier today. Pt denies swelling or drainage. Pt states he thinks it has a cavity.

## 2016-06-11 NOTE — ED Provider Notes (Signed)
[facility] Emergency Department Provider Note   First MD Initiated Contact with Patient 06/11/16 [phone]     (approximate)  I have reviewed the triage vital signs and the nursing notes.   HISTORY  Chief Complaint Dental Pain    HPI Patrick Higgins is a 35 y.o. male presents with 1 day history of dental pain. Patient admits to pain at his left maxillary central incisor. Patient denies any fever no difficulty swallowing.   Past medical history Multiple dental caries There are no active problems to display for this patient.   Past surgical history None  Prior to Admission medications   Medication Sig Start Date End Date Taking? Authorizing Provider  amoxicillin (AMOXIL) 500 MG tablet Take 1 tablet (500 mg total) by mouth 3 (three) times daily with meals. 01/31/16   Jami L Hagler, PA-C  cephALEXin (KEFLEX) 500 MG capsule Take 1 capsule (500 mg total) by mouth 4 (four) times daily. 03/01/15   Tommi Rumpshonda L Summers, PA-C  dexamethasone (DECADRON) 2 MG tablet Take 6 tablets on Day 1 with food, then decrease by 1 tablet daily until finished (6,5,4,3,2,1) 01/31/16   Jami L Hagler, PA-C  ibuprofen (ADVIL,MOTRIN) 200 MG tablet Take 400 mg by mouth every 2 (two) hours as needed. For pain    Historical Provider, MD  lidocaine (XYLOCAINE) 2 % solution Use as directed 10 mLs in the mouth or throat every 4 (four) hours as needed for mouth pain. 01/31/16   Jami L Hagler, PA-C  oxyCODONE-acetaminophen (PERCOCET) 5-325 MG per tablet Take 1 tablet by mouth every 4 (four) hours as needed for severe pain. 03/01/15   Tommi Rumpshonda L Summers, PA-C  traMADol (ULTRAM) 50 MG tablet Take 1 tablet (50 mg total) by mouth every 6 (six) hours as needed. 01/31/16   Jami L Hagler, PA-C    Allergies Hydrocodone  No family history on file.  Social History Social History  Substance Use Topics  . Smoking status: Heavy Tobacco Smoker    Packs/day: 1.00    Types: Cigarettes  . Smokeless tobacco: Never  Used  . Alcohol use No    Review of Systems Constitutional: No fever/chills Eyes: No visual changes. ENT: No sore throat. Positive for dental pain Cardiovascular: Denies chest pain. Respiratory: Denies shortness of breath. Gastrointestinal: No abdominal pain.  No nausea, no vomiting.  No diarrhea.  No constipation. Genitourinary: Negative for dysuria. Musculoskeletal: Negative for back pain. Skin: Negative for rash. Neurological: Negative for headaches, focal weakness or numbness.  10-point ROS otherwise negative.  ____________________________________________   PHYSICAL EXAM:  VITAL SIGNS: ED Triage Vitals [06/11/16 0234]  Enc Vitals Group     BP 125/84     Pulse Rate 65     Resp 16     Temp 97.9 F (36.6 C)     Temp Source Oral     SpO2 100 %     Weight 195 lb (88.5 kg)     Height 6\' 3"  (1.905 m)     Head Circumference      Peak Flow      Pain Score 8     Pain Loc      Pain Edu?      Excl. in GC?     Constitutional: Alert and oriented. Well appearing and in no acute distress. Eyes: Conjunctivae are normal. PERRL. EOMI. Head: Atraumatic. Mouth/Throat: Mucous membranes are moist.  Oropharynx non-erythematous.Multiple dental caries noted Neck: No stridor.   Musculoskeletal: No lower extremity tenderness nor  edema. No gross deformities of extremities. Neurologic:  Normal speech and language. No gross focal neurologic deficits are appreciated.  Skin:  Skin is warm, dry and intact. No rash noted. Psychiatric: Mood and affect are normal. Speech and behavior are normal.    Procedures     INITIAL IMPRESSION / ASSESSMENT AND PLAN / ED COURSE  Pertinent labs & imaging results that were available during my care of the patient were reviewed by me and considered in my medical decision making (see chart for details).  Given clindamycin 300 mg of be prescribed same for home. Patient given Toradol for pain at home.   Clinical Course      ____________________________________________  FINAL CLINICAL IMPRESSION(S) / ED DIAGNOSES  Final diagnoses:  Dental caries     MEDICATIONS GIVEN DURING THIS VISIT:  Medications  lidocaine (XYLOCAINE) 2 % viscous mouth solution 15 mL (not administered)  clindamycin (CLEOCIN) capsule 300 mg (not administered)  ketorolac (TORADOL) tablet 10 mg (not administered)     NEW OUTPATIENT MEDICATIONS STARTED DURING THIS VISIT:  New Prescriptions   No medications on file    Modified Medications   No medications on file    Discontinued Medications   No medications on file     Note:  This document was prepared using Dragon voice recognition software and may include unintentional dictation errors.    Darci Currentandolph N Nico Rogness, MD 06/11/16 [phone]

## 2017-02-03 ENCOUNTER — Emergency Department
Admission: EM | Admit: 2017-02-03 | Discharge: 2017-02-03 | Disposition: A | Discharge status: Home / Self Care | Payer: Self-pay | Attending: Emergency Medicine | Admitting: Emergency Medicine

## 2017-02-03 ENCOUNTER — Emergency Department: Payer: Self-pay

## 2017-02-03 ENCOUNTER — Encounter: Payer: Self-pay | Admitting: Emergency Medicine

## 2017-02-03 DIAGNOSIS — S91114A Laceration without foreign body of right lesser toe(s) without damage to nail, initial encounter: Secondary | ICD-10-CM

## 2017-02-03 DIAGNOSIS — S92501B Displaced unspecified fracture of right lesser toe(s), initial encounter for open fracture: Secondary | ICD-10-CM

## 2017-02-03 DIAGNOSIS — F17210 Nicotine dependence, cigarettes, uncomplicated: Secondary | ICD-10-CM | POA: Insufficient documentation

## 2017-02-03 DIAGNOSIS — Y9389 Activity, other specified: Secondary | ICD-10-CM | POA: Insufficient documentation

## 2017-02-03 DIAGNOSIS — S92591B Other fracture of right lesser toe(s), initial encounter for open fracture: Secondary | ICD-10-CM | POA: Insufficient documentation

## 2017-02-03 DIAGNOSIS — Y998 Other external cause status: Secondary | ICD-10-CM | POA: Insufficient documentation

## 2017-02-03 DIAGNOSIS — W293XXA Contact with powered garden and outdoor hand tools and machinery, initial encounter: Secondary | ICD-10-CM | POA: Insufficient documentation

## 2017-02-03 DIAGNOSIS — Y929 Unspecified place or not applicable: Secondary | ICD-10-CM | POA: Insufficient documentation

## 2017-02-03 MED ORDER — LIDOCAINE HCL (PF) 1 % IJ SOLN
5.0000 mL | Freq: Once | INTRAMUSCULAR | Status: AC
  Administered 2017-02-03: 5 mL
  Filled 2017-02-03: qty 5

## 2017-02-03 MED ORDER — CEPHALEXIN 500 MG PO CAPS: 500.0000 mg | ORAL_CAPSULE | Freq: Three times a day (TID) | ORAL | 0 refills | Status: DC

## 2017-02-03 MED ORDER — OXYCODONE-ACETAMINOPHEN 5-325 MG PO TABS: 1.0000 | ORAL_TABLET | Freq: Four times a day (QID) | ORAL | 0 refills | Status: DC | PRN

## 2017-02-03 MED ORDER — OXYCODONE-ACETAMINOPHEN 5-325 MG PO TABS: 1.0000 | ORAL_TABLET | ORAL | 0 refills | Status: DC | PRN

## 2017-02-03 MED ORDER — CEFAZOLIN SODIUM-DEXTROSE 1-4 GM/50ML-% IV SOLN
1.0000 g | Freq: Once | INTRAVENOUS | Status: AC
  Administered 2017-02-03: 1 g via INTRAVENOUS
  Filled 2017-02-03: qty 50

## 2017-02-03 MED ORDER — OXYCODONE-ACETAMINOPHEN 5-325 MG PO TABS
2.0000 | ORAL_TABLET | Freq: Once | ORAL | Status: AC
  Administered 2017-02-03: 2 via ORAL
  Filled 2017-02-03: qty 2

## 2017-02-03 NOTE — ED Provider Notes (Signed)
[facility] Emergency Department Provider Note  ____________________________________________   First MD Initiated Contact with Patient 02/03/17 1111     (approximate)  I have reviewed the triage vital signs and the nursing notes.   HISTORY  Chief Complaint Laceration   HPI Patrick Higgins is a 36 y.o. male is here with laceration to his right fifth toe. Patient states he was using a chain saw this morning and cut through his boot into his small toe. Patient states that his tetanus is less than 5 years. There is no active bleeding at this time. Currently his pain is 9/10.   History reviewed. No pertinent past medical history.  There are no active problems to display for this patient.   History reviewed. No pertinent surgical history.  Prior to Admission medications   Medication Sig Start Date End Date Taking? Authorizing Provider  cephALEXin (KEFLEX) 500 MG capsule Take 1 capsule (500 mg total) by mouth 3 (three) times daily. 02/03/17   Tommi RumpsSummers, Rhonda L, PA-C  ibuprofen (ADVIL,MOTRIN) 200 MG tablet Take 400 mg by mouth every 2 (two) hours as needed. For pain    [provider]  oxyCODONE-acetaminophen (PERCOCET) 5-325 MG tablet Take 1 tablet by mouth every 6 (six) hours as needed for severe pain. 02/03/17   Tommi RumpsSummers, Rhonda L, PA-C    Allergies Hydrocodone  No family history on file.  Social History Social History  Substance Use Topics  . Smoking status: Heavy Tobacco Smoker    Packs/day: 1.00    Types: Cigarettes  . Smokeless tobacco: Never Used  . Alcohol use No    Review of Systems Constitutional: No fever/chills Cardiovascular: Denies chest pain. Respiratory: Denies shortness of breath. Musculoskeletal: Positive right fifth toe pain. Skin: Positive for laceration right fifth toe. Neurological: Negative for headaches, focal weakness or numbness.   ____________________________________________   PHYSICAL EXAM:  VITAL  SIGNS: ED Triage Vitals  Enc Vitals Group     BP 02/03/17 1032 119/71     Pulse Rate 02/03/17 1032 60     Resp 02/03/17 1032 16     Temp 02/03/17 1032 97.6 F (36.4 C)     Temp Source 02/03/17 1032 Oral     SpO2 02/03/17 1032 99 %     Weight 02/03/17 1035 195 lb (88.5 kg)     Height 02/03/17 1035 6\' 3"  (1.905 m)     Head Circumference --      Peak Flow --      Pain Score 02/03/17 1032 9     Pain Loc --      Pain Edu? --      Excl. in GC? --     Constitutional: Alert and oriented. Well appearing and in no acute distress. Eyes: Conjunctivae are normal. PERRL. EOMI. Head: Atraumatic. Neck: No stridor.  Cardiovascular: Normal rate, regular rhythm. Grossly normal heart sounds.  Good peripheral circulation. Respiratory: Normal respiratory effort.  No retractions. Lungs CTAB. Musculoskeletal: Examination of the right fifth toe there is a jagged laceration on the dorsal aspect at the base of the fifth digit. There is no active bleeding at present. Capillary refill is less than 3 seconds. Sensory perception is present and equal in comparison with remaining digits. Patient is unable to flex or extend his fifth digit. Neurologic:  Normal speech and language. No gross focal neurologic deficits are appreciated. Skin:  Skin is warm, dry.  Laceration as listed above approximately 2 cm total. Psychiatric: Mood and affect are normal. Speech  and behavior are normal.  ____________________________________________   LABS (all labs ordered are listed, but only abnormal results are displayed)  Labs Reviewed - No data to display RADIOLOGY  Dg Toe 5th Right  Result Date: 02/03/2017 CLINICAL DATA:  Injury with chainsaw EXAM: RIGHT FIFTH TOE:  3 V COMPARISON:  None. FINDINGS: Frontal, oblique, and lateral views were obtained. There is a comminuted fracture of the mid the distal aspect of the fifth proximal phalanx. A portion of the bone from the mid the distal aspect of the proximal phalanx is  displaced dorsally. There is associated soft tissue injury in this area. No other fractures. No dislocations. Joint spaces appear normal. IMPRESSION: Comminuted fracture mid the distal aspect of the fifth proximal phalanx with portion of the mid the distal aspect of the fifth proximal phalanx displaced dorsally. Associated soft tissue injury. No other fractures. No dislocations. No appreciable arthropathic change. Electronically Signed   By: Bretta BangWilliam  Woodruff III M.D.   On: 02/03/2017 11:58    ____________________________________________   PROCEDURES  Procedure(s) performed: LACERATION REPAIR Performed by: Tommi Rumpshonda L Summers Authorized by: Tommi Rumpshonda L Summers Consent: Verbal consent obtained. Risks and benefits: risks, benefits and alternatives were discussed Consent given by: patient Patient identity confirmed: provided demographic data Prepped and Draped in normal sterile fashion Wound explored  Laceration Location: Right fifth toe.  Laceration Length: 2.0 cm  No Foreign Bodies seen or palpated  Anesthesia: local infiltration  Local anesthetic: lidocaine 1 % without epinephrine  Anesthetic total: 7 ml  Irrigation method: Syringe  Amount of cleaning: Moderate   Skin closure: 4-0 Ethilon   Number of sutures: 6   Technique: Simple interrupted   Patient tolerance: Patient tolerated the procedure well with no immediate complications.  Procedures  Critical Care performed: No  ____________________________________________   INITIAL IMPRESSION / ASSESSMENT AND PLAN / ED COURSE  Pertinent labs & imaging results that were available during my care of the patient were reviewed by me and considered in my medical decision making (see chart for details).  Patient was given results of x-ray and explained that this is considered to be an open fracture. He was given Percocet prior to x-ray. He received  Kefzol 1 g IV. Patient is to follow-up with podiatry and information and location  was given in discharge papers. Patient's will continue taking Keflex 500 mg 3 times a day for 10 days. He was given a prescription for Percocet 5/325 one every 6 hours as needed for pain. He is encouraged to elevate to reduce swelling. We discussed keeping this area clean and dry and that he does not want an infection in the bone. Patient was made aware that he could not drive or operate machinery while taking the pain medication. Family members are present and also heard these instructions.      ____________________________________________   FINAL CLINICAL IMPRESSION(S) / ED DIAGNOSES  Final diagnoses:  Fracture of fifth toe, right, open, initial encounter  Laceration of fifth toe of right foot, initial encounter      NEW MEDICATIONS STARTED DURING THIS VISIT:  Discharge Medication List as of 02/03/2017 12:59 PM       Note:  This document was prepared using Dragon voice recognition software and may include unintentional dictation errors.    Tommi RumpsSummers, Rhonda L, PA-C 02/03/17 1410    Patrick PlantMcShane, James A, MD 02/03/17 [phone]

## 2017-02-03 NOTE — Discharge Instructions (Signed)
Call today and make an appointment with Dr. Alberteen Spindleline who is the podiatrist on call for [facility]. Keep area clean and dry. Change dressing daily. Watch for any signs of infection. Take Keflex 3 times a day until finished to prevent infection. Percocet 1 tablet every 6 hours as needed for pain. Elevate foot to reduce swelling which will also reduce pain.

## 2017-02-03 NOTE — ED Notes (Signed)
See triage note.  States while using a chainsaw this am   Laceration noted right foot bleeding controlled at present

## 2017-02-03 NOTE — ED Notes (Signed)
Dressing applied to right fifth toe, post op shoe applied as well.

## 2017-02-03 NOTE — ED Triage Notes (Signed)
Patient states he was using a chainsaw this AM and cut right small toe through his boot.  Not actively bleeding.  Ice pack applied.  Pain 9/10.

## 2017-02-12 ENCOUNTER — Encounter: Payer: Self-pay | Admitting: Emergency Medicine

## 2017-02-12 ENCOUNTER — Emergency Department: Payer: Self-pay

## 2017-02-12 ENCOUNTER — Emergency Department
Admission: EM | Admit: 2017-02-12 | Discharge: 2017-02-12 | Disposition: A | Discharge status: Home / Self Care | Payer: Self-pay | Attending: Emergency Medicine | Admitting: Emergency Medicine

## 2017-02-12 DIAGNOSIS — Z5189 Encounter for other specified aftercare: Secondary | ICD-10-CM

## 2017-02-12 DIAGNOSIS — F17210 Nicotine dependence, cigarettes, uncomplicated: Secondary | ICD-10-CM | POA: Insufficient documentation

## 2017-02-12 DIAGNOSIS — Z4800 Encounter for change or removal of nonsurgical wound dressing: Secondary | ICD-10-CM | POA: Insufficient documentation

## 2017-02-12 DIAGNOSIS — L989 Disorder of the skin and subcutaneous tissue, unspecified: Secondary | ICD-10-CM | POA: Insufficient documentation

## 2017-02-12 MED ORDER — MUPIROCIN 2 % EX OINT: TOPICAL_OINTMENT | CUTANEOUS | 0 refills | Status: DC

## 2017-02-12 NOTE — ED Provider Notes (Signed)
[facility] Emergency Department Provider Note       Time seen: ----------------------------------------- 10:15 PM on 02/12/2017 -----------------------------------------     I have reviewed the triage vital signs and the nursing notes.   HISTORY   Chief Complaint Wound Infection    HPI Patrick Higgins is a 36 y.o. male who presents to the ED for right fifth toe injury. Patient was seen last Tuesday after he cut his right fifth toe with a chainsaw. Patient has sutures placed here and is currently taking antibiotics. He was seen by podiatry but cannot afford to have follow-up. Patient reports over the last 3-4 days he's noted redness. He has not had worsening symptoms after he tried to the toe with what he thought was a pus pocket.   History reviewed. No pertinent past medical history.  There are no active problems to display for this patient.   History reviewed. No pertinent surgical history.  Allergies Hydrocodone  Social History Social History  Substance Use Topics  . Smoking status: Heavy Tobacco Smoker    Packs/day: 1.00    Types: Cigarettes  . Smokeless tobacco: Never Used  . Alcohol use No    Review of Systems Constitutional: Negative for fever. Musculoskeletal: Positive for right fifth digit pain Skin: Positive for redness Neurological: Negative for headaches, focal weakness or numbness.  All systems negative/normal/unremarkable except as stated in the HPI  ____________________________________________   PHYSICAL EXAM:  VITAL SIGNS: ED Triage Vitals  Enc Vitals Group     BP 02/12/17 2117 126/77     Pulse Rate 02/12/17 2117 88     Resp 02/12/17 2117 18     Temp 02/12/17 2117 98.3 F (36.8 C)     Temp Source 02/12/17 2117 Oral     SpO2 02/12/17 2117 97 %     Weight 02/12/17 2117 195 lb (88.5 kg)     Height 02/12/17 2117 6\' 3"  (1.905 m)     Head Circumference --      Peak Flow --      Pain Score 02/12/17 2120 8   Pain Loc --      Pain Edu? --      Excl. in GC? --     Constitutional: Alert and oriented. Well appearing and in no distress. Musculoskeletal: Tenderness and around the right fifth digit. There is a wound which has separated at the base of the right fifth digit dorsally. Sutures have not held the wound in place. Overall good granulation tissue, no signs of abscess. Neurologic:  Normal speech and language. No gross focal neurologic deficits are appreciated.  Skin:  Erythema with some medial right fifth digit yellow discoloration of the skin but no frank abscess is present. Psychiatric: Mood and affect are normal. Speech and behavior are normal.  ____________________________________________  ED COURSE:  Pertinent labs & imaging results that were available during my care of the patient were reviewed by me and considered in my medical decision making (see chart for details). Patient presents for wound recheck, we will assess with imaging as indicated.   Procedures  RADIOLOGY Images were viewed by me  Right fifth toe  IMPRESSION: No obvious evidence by x-ray of osteomyelitis. The injured fifth proximal phalanx again demonstrate fracture without significant displacement. ____________________________________________  FINAL ASSESSMENT AND PLAN  Wound recheck, suture removal  Plan: Patient's imaging was dictated above. Patient had presented for a wound recheck and overall the wound appears to be healing well. X-rays don't show any osteomyelitis. Advised  ibuprofen for pain. He is stable for outpatient follow-up.   Emily FilbertWilliams, Aishia Barkey E, MD   Note: This note was generated in part or whole with voice recognition software. Voice recognition is usually quite accurate but there are transcription errors that can and very often do occur. I apologize for any typographical errors that were not detected and corrected.     Emily FilbertWilliams, Alma Mohiuddin E, MD 02/12/17 [phone]

## 2017-02-12 NOTE — ED Notes (Signed)
Removed Stitches from pt toe.

## 2017-02-12 NOTE — ED Notes (Signed)
Pt states that last Tuesday he cut his foot (pinky toe) with a chainsaw. Came here and got stitches and followed up podiatrist wed. Was told to follow up in a week. He didn't follow up because he couldn't afford it. He states that a few days ago it started swelling and pus forming. He states he may have also bust a stitch or two. Worried about infection.

## 2017-02-12 NOTE — ED Triage Notes (Signed)
Pt to triage via w/c with no distress noted; ; st last Tuesday cut right 5th toe with chainsaw; seen here in ED for sutures and rx antibiotics--still taking; Seen Dr Graciela HusbandsKlein, podiatrist next Wed, took xray and told to f/u in a week; st unable to pay to have f/u; last 3-4 days has noted redness, and "pus pocket" so took a needle to attempt to open it; now with worsening symptoms

## 2017-06-01 ENCOUNTER — Other Ambulatory Visit: Payer: Self-pay

## 2017-06-01 ENCOUNTER — Encounter: Payer: Self-pay | Admitting: Intensive Care

## 2017-06-01 ENCOUNTER — Emergency Department
Admission: EM | Admit: 2017-06-01 | Discharge: 2017-06-01 | Disposition: A | Discharge status: Home / Self Care | Payer: Self-pay | Attending: Emergency Medicine | Admitting: Emergency Medicine

## 2017-06-01 DIAGNOSIS — F17210 Nicotine dependence, cigarettes, uncomplicated: Secondary | ICD-10-CM | POA: Insufficient documentation

## 2017-06-01 DIAGNOSIS — T401X1A Poisoning by heroin, accidental (unintentional), initial encounter: Secondary | ICD-10-CM | POA: Insufficient documentation

## 2017-06-01 HISTORY — DX: Cardiac murmur, unspecified: R01.1

## 2017-06-01 NOTE — ED Triage Notes (Signed)
Patient states "I was in my car and snorted some heroin" I went back into work and then co-workers said they found me unresponsive. Cops came and gave patient some narcan and patient became responsive and denied treatment from cops to come to hospital. Patient states EMT told him he should come get checked out so that's why he is here. Patient is A&O x4 at this time.

## 2017-06-01 NOTE — ED Provider Notes (Signed)
[facility] Emergency Department Provider Note  ____________________________________________  Time seen: Approximately 4:33 PM  I have reviewed the triage vital signs and the nursing notes.   HISTORY  Chief Complaint No chief complaint on file.    HPI Patrick Higgins is a 36 y.o. male brought to the ED after using heroin about 1:30 PM today in his car. He went back into work and estimates that about 30 minutes after the use, he was found unresponsive at work. First responders administered Narcan, bringing the patient back to normal mental status. Denies any symptoms whatsoever. He does note that he had recently been off of heroin for a few weeks, and so may have had less tolerance. He did not use more than he normally would have.Denies any attempt to harm himself. Doesn't take any other medications, no other ingestions. No HI or hallucinations     Past Medical History:  Diagnosis Date  . Heart murmur      There are no active problems to display for this patient.    History reviewed. No pertinent surgical history.   Prior to Admission medications   Medication Sig Start Date End Date Taking? Authorizing Provider  cephALEXin (KEFLEX) 500 MG capsule Take 1 capsule (500 mg total) by mouth 3 (three) times daily. 02/03/17   Tommi RumpsSummers, Rhonda L, PA-C  ibuprofen (ADVIL,MOTRIN) 200 MG tablet Take 400 mg by mouth every 2 (two) hours as needed. For pain    [provider]  mupirocin ointment (BACTROBAN) 2 % Apply to affected area 3 times daily 02/12/17 02/12/18  Emily FilbertWilliams, Jonathan E, MD  oxyCODONE-acetaminophen (PERCOCET) 5-325 MG tablet Take 1 tablet by mouth every 6 (six) hours as needed for severe pain. 02/03/17   Tommi RumpsSummers, Rhonda L, PA-C     Allergies Hydrocodone   History reviewed. No pertinent family history.  Social History Social History   Tobacco Use  . Smoking status: Heavy Tobacco Smoker    Packs/day: 1.00    Types: Cigarettes  . Smokeless  tobacco: Never Used  Substance Use Topics  . Alcohol use: Yes    Comment: occ  . Drug use: Yes    Comment: herion    Review of Systems  Constitutional:   No fever or chills.   Cardiovascular:   No chest pain or syncope. Respiratory:   No dyspnea or cough. Gastrointestinal:   Negative for abdominal pain, vomiting and diarrhea.  Musculoskeletal:   Negative for focal pain or swelling All other systems reviewed and are negative except as documented above in ROS and HPI.  ____________________________________________   PHYSICAL EXAM:  VITAL SIGNS: ED Triage Vitals [06/01/17 1426]  Enc Vitals Group     BP 138/83     Pulse Rate (!) 109     Resp 16     Temp 98.6 F (37 C)     Temp Source Oral     SpO2 98 %     Weight 190 lb (86.2 kg)     Height 6\' 3"  (1.905 m)     Head Circumference      Peak Flow      Pain Score      Pain Loc      Pain Edu?      Excl. in GC?     Vital signs reviewed, nursing assessments reviewed.   Constitutional:   Alert and oriented. Well appearing and in no distress. Eyes:   No scleral icterus.  EOMI.PERRL. ENT   Head:   Normocephalic and  atraumatic.   Nose:   No congestion/rhinnorhea. No epistaxis   Mouth/Throat:   MMM   Neck:   No meningismus. Full ROM. No midline tenderness Hematological/Lymphatic/Immunilogical:   No cervical lymphadenopathy. Cardiovascular:   RRR. Symmetric bilateral radial and DP pulses.  No murmurs.  Respiratory:   Normal respiratory effort without tachypnea/retractions. Breath sounds are clear and equal bilaterally. No wheezes/rales/rhonchi. Musculoskeletal:   Normal range of motion in all extremities. No joint effusions.  No lower extremity tenderness.  No edema. Neurologic:   Normal speech and language.  Motor grossly intact. No gross focal neurologic deficits are appreciated.  Skin:    Skin is warm, dry and intact. No track marks, cellulitis or abscess.____________________________________________     LABS (pertinent positives/negatives) (all labs ordered are listed, but only abnormal results are displayed) Labs Reviewed - No data to display ____________________________________________   EKG  Interpreted by me Sinus tachycardia rate 102, normal axis intervals QRS ST segments and T waves  ____________________________________________    RADIOLOGY  No results found.  ____________________________________________   PROCEDURES Procedures  ____________________________________________     CLINICAL IMPRESSION / ASSESSMENT AND PLAN / ED COURSE  Pertinent labs & imaging results that were available during my care of the patient were reviewed by me and considered in my medical decision making (see chart for details).     Clinical Course as of Jun 01 1632  Mon Jun 01, 2017  1526 IN heroin more than 2 hours ago. Had depressed mental status afterward, received narcan on scene.  Completely asymptomatic. No si/hi/hallucinations. Medically stable for outpatient follow up. Has supportive family at bedside. Will give SA resources.   [PS]    Clinical Course User Index [PS] Sharman CheekStafford, Naraya Stoneberg, MD    ----------------------------------------- 4:36 PM on 06/01/2017 -----------------------------------------  Steady gait, ambulatory, asymptomatic, clear speech. He sober, not a risk for a relapsing intoxication at this point, suitable for discharge home.   ____________________________________________   FINAL CLINICAL IMPRESSION(S) / ED DIAGNOSES    Final diagnoses:  Heroin overdose, accidental or unintentional, initial encounter (HCC)      This SmartLink is deprecated. Use AVSMEDLIST instead to display the medication list for a patient.   Portions of this note were generated with dragon dictation software. Dictation errors may occur despite best attempts at proofreading.    Sharman CheekStafford, Deola Rewis, MD 06/01/17 [id number]

## 2017-08-25 ENCOUNTER — Emergency Department
Admission: EM | Admit: 2017-08-25 | Discharge: 2017-08-25 | Disposition: A | Discharge status: Home / Self Care | Payer: Self-pay | Attending: Emergency Medicine | Admitting: Emergency Medicine

## 2017-08-25 ENCOUNTER — Encounter: Payer: Self-pay | Admitting: Emergency Medicine

## 2017-08-25 ENCOUNTER — Other Ambulatory Visit: Payer: Self-pay

## 2017-08-25 ENCOUNTER — Emergency Department: Payer: Self-pay

## 2017-08-25 DIAGNOSIS — F17210 Nicotine dependence, cigarettes, uncomplicated: Secondary | ICD-10-CM | POA: Insufficient documentation

## 2017-08-25 DIAGNOSIS — Y939 Activity, unspecified: Secondary | ICD-10-CM | POA: Insufficient documentation

## 2017-08-25 DIAGNOSIS — Y992 Volunteer activity: Secondary | ICD-10-CM | POA: Insufficient documentation

## 2017-08-25 DIAGNOSIS — M25522 Pain in left elbow: Secondary | ICD-10-CM | POA: Insufficient documentation

## 2017-08-25 DIAGNOSIS — W109XXA Fall (on) (from) unspecified stairs and steps, initial encounter: Secondary | ICD-10-CM | POA: Insufficient documentation

## 2017-08-25 DIAGNOSIS — S0990XA Unspecified injury of head, initial encounter: Secondary | ICD-10-CM

## 2017-08-25 DIAGNOSIS — S0083XA Contusion of other part of head, initial encounter: Secondary | ICD-10-CM | POA: Insufficient documentation

## 2017-08-25 DIAGNOSIS — Y92009 Unspecified place in unspecified non-institutional (private) residence as the place of occurrence of the external cause: Secondary | ICD-10-CM | POA: Insufficient documentation

## 2017-08-25 DIAGNOSIS — Z79899 Other long term (current) drug therapy: Secondary | ICD-10-CM | POA: Insufficient documentation

## 2017-08-25 DIAGNOSIS — R55 Syncope and collapse: Secondary | ICD-10-CM | POA: Insufficient documentation

## 2017-08-25 LAB — BASIC METABOLIC PANEL
ANION GAP: 8 (ref 5–15)
BUN: 17 mg/dL (ref 6–20)
CO2: 27 mmol/L (ref 22–32)
CREATININE: 0.82 mg/dL (ref 0.61–1.24)
Calcium: 8.8 mg/dL — ABNORMAL LOW (ref 8.9–10.3)
Chloride: 105 mmol/L (ref 101–111)
GFR calc Af Amer: >60 mL/min (ref >60)
GFR calc non Af Amer: >60 mL/min (ref >60)
Glucose, Bld: 173 mg/dL — ABNORMAL HIGH (ref 65–99)
POTASSIUM: 4.2 mmol/L (ref 3.5–5.1)
SODIUM: 140 mmol/L (ref 135–145)

## 2017-08-25 LAB — CBC
HEMATOCRIT: 44 % (ref 40.0–52.0)
HEMOGLOBIN: 14.5 g/dL (ref 13.0–18.0)
MCH: 26.1 pg (ref 26.0–34.0)
MCHC: 33 g/dL (ref 32.0–36.0)
MCV: 79 fL — AB (ref 80.0–100.0)
PLATELETS: 175 10*3/uL (ref 150–440)
RBC: 5.57 MIL/uL (ref 4.40–5.90)
RDW: 14 % (ref 11.5–14.5)
WBC: 11.9 10*3/uL — AB (ref 3.8–10.6)

## 2017-08-25 MED ORDER — KETOROLAC TROMETHAMINE 30 MG/ML IJ SOLN
30.0000 mg | Freq: Once | INTRAMUSCULAR | Status: AC
  Administered 2017-08-25: 30 mg via INTRAVENOUS
  Filled 2017-08-25: qty 1

## 2017-08-25 MED ORDER — SODIUM CHLORIDE 0.9 % IV BOLUS (SEPSIS)
1000.0000 mL | Freq: Once | INTRAVENOUS | Status: AC
  Administered 2017-08-25: 1000 mL via INTRAVENOUS

## 2017-08-25 NOTE — ED Notes (Signed)
Report off to Kasey rn  

## 2017-08-25 NOTE — ED Triage Notes (Signed)
Pt to triage via w/c with no distress noted; pt reports donating plasma at 2pm; went to family's house and became dizzy with syncopal episode; bruising beneath left eye, abrasion over left eye and bridge of nose; c/o persistent left sided HA, left elbow, left shoulder, facial and neck pain

## 2017-08-25 NOTE — Discharge Instructions (Signed)
Please seek medical attention for any high fevers, chest pain, shortness of breath, change in behavior, persistent vomiting, bloody stool or any other new or concerning symptoms.  

## 2017-08-25 NOTE — ED Provider Notes (Signed)
[facility] Emergency Department Provider Note  ____________________________________________   I have reviewed the triage vital signs and the nursing notes.   HISTORY  Chief Complaint Loss of Consciousness   History limited by: Not Limited   HPI Patrick Higgins is a 36 y.o. male who presents to the emergency department today after a syncopal episode and falling down steps. Patient donated plasma today. Afterwards when he stood up quickly he started feeling dizzy. He was at the top of some stairs and states the next thing he knew was that he was at the bottom of the stairs. He did have a couple more episodes of dizziness after this happened. The patient is complaining of left elbow pain and left facial pain primarily. He denies any change in vision or blurry vision.   Per medical record review patient has a history of heart murmur.   Past Medical History:  Diagnosis Date  . Heart murmur     There are no active problems to display for this patient.   History reviewed. No pertinent surgical history.  Prior to Admission medications   Medication Sig Start Date End Date Taking? Authorizing Provider  cephALEXin (KEFLEX) 500 MG capsule Take 1 capsule (500 mg total) by mouth 3 (three) times daily. 02/03/17   Tommi Rumps, PA-C  ibuprofen (ADVIL,MOTRIN) 200 MG tablet Take 400 mg by mouth every 2 (two) hours as needed. For pain    [provider]  mupirocin ointment (BACTROBAN) 2 % Apply to affected area 3 times daily 02/12/17 02/12/18  Emily Filbert, MD  oxyCODONE-acetaminophen (PERCOCET) 5-325 MG tablet Take 1 tablet by mouth every 6 (six) hours as needed for severe pain. 02/03/17   Tommi Rumps, PA-C    Allergies Hydrocodone  No family history on file.  Social History Social History   Tobacco Use  . Smoking status: Heavy Tobacco Smoker    Packs/day: 1.00    Types: Cigarettes  . Smokeless tobacco: Never Used  Substance Use Topics   . Alcohol use: Yes    Comment: occ  . Drug use: Yes    Comment: herion    Review of Systems Constitutional: No fever/chills Eyes: No visual changes. ENT: No sore throat. Cardiovascular: Denies chest pain. Respiratory: Denies shortness of breath. Gastrointestinal: No abdominal pain.  No nausea, no vomiting.  No diarrhea.  Genitourinary: Negative for dysuria. Musculoskeletal: Positive for left elbow pain. Skin: Negative for rash. Neurological: Positive for headache.  ____________________________________________   PHYSICAL EXAM:  VITAL SIGNS: ED Triage Vitals [08/25/17 1928]  Enc Vitals Group     BP 98/61     Pulse Rate 83     Resp 18     Temp 98 F (36.7 C)     Temp Source Oral     SpO2 100 %     Weight 194 lb (88 kg)     Height 6\' 3"  (1.905 m)     Head Circumference      Peak Flow      Pain Score 9   Constitutional: Alert and oriented. Well appearing and in no distress. Eyes: Conjunctivae are normal.  ENT   Head: Normocephalic. Bruising noted to left periorbital region.   Nose: No congestion/rhinnorhea.   Mouth/Throat: Mucous membranes are moist.   Neck: No stridor. No midline tenderness. Some tenderness to left side of neck. Hematological/Lymphatic/Immunilogical: No cervical lymphadenopathy. Cardiovascular: Normal rate, regular rhythm.  No murmurs, rubs, or gallops.  Respiratory: Normal respiratory effort without tachypnea nor  retractions. Breath sounds are clear and equal bilaterally. No wheezes/rales/rhonchi. Gastrointestinal: Soft and non tender. No rebound. No guarding.  Genitourinary: Deferred Musculoskeletal: Normal range of motion in all extremities. No obvious deformity of upper or lower extremity. Small bruise to olecranon process with tenderness. Full ROM of left elbow.  Neurologic:  Normal speech and language. No gross focal neurologic deficits are appreciated.  Skin:  Skin is warm, dry and intact. No rash noted. Psychiatric: Mood and  affect are normal. Speech and behavior are normal. Patient exhibits appropriate insight and judgment.  ____________________________________________    LABS (pertinent positives/negatives)  CBC wbc 11.9, hgb 14.5, plt 175 BMP na 140, k 4.2, glu 173, cr 0.82  ____________________________________________   EKG  I, Phineas SemenGraydon Plato Alspaugh, attending physician, personally viewed and interpreted this EKG  EKG Time: 1937 Rate: 71 Rhythm: sinus rhythm Axis: normal Intervals: qtc 420 QRS: narrow ST changes: no st elevation Impression: normal ekg   ____________________________________________    RADIOLOGY  CT head No acute bleed/fracture  Left elbow No fracture/dislocation  ____________________________________________   PROCEDURES  Procedures  ____________________________________________   INITIAL IMPRESSION / ASSESSMENT AND PLAN / ED COURSE  Pertinent labs & imaging results that were available during my care of the patient were reviewed by me and considered in my medical decision making (see chart for details).  Patient presented to the emergency department today because of concerns for injuries after a fall and syncopal episode.  This point I think syncopal episode likely related to the fact that the patient gave plasma today.  Blood work without any concerning findings.  EKG without concerning findings.  Head CT left elbow x-ray negative for acute fractures.  Will plan on discharging home.  Discussed patient's care with future plasma giving.    ____________________________________________   FINAL CLINICAL IMPRESSION(S) / ED DIAGNOSES  Final diagnoses:  Syncope, unspecified syncope type  Minor head injury, initial encounter     Note: This dictation was prepared with Dragon dictation. Any transcriptional errors that result from this process are unintentional     Phineas SemenGoodman, Claretha Townshend, MD 08/25/17 2258

## 2017-08-25 NOTE — ED Notes (Signed)
Pt states he has plasma drawn today at 1400.  At 1500, pt fell down approx 12 steps after becoming dizzy.  Pt has abrasion to forehead, bruising to left eye, pain in left shoulder and pain in left elbow.  Headache to left side of face.  Pt states his neck and upper back feel sore.  States loc for a few seconds.  No vomiting.  Pt alert.  Speech clear.  Pt denies vomiting or black stools.   Iv started and labs sent. nsr on monitor.    Family with pt.

## 2017-08-25 NOTE — ED Notes (Signed)
Pt taken to room 2, placed in hosp gown & on card monitor for EKG and further monitoring; report given to care nurse Rosanne SackKasey, RN

## 2018-02-25 ENCOUNTER — Emergency Department
Admission: EM | Admit: 2018-02-25 | Discharge: 2018-02-25 | Disposition: A | Discharge status: Home / Self Care | Payer: Self-pay | Attending: Student in an Organized Health Care Education/Training Program | Admitting: Student in an Organized Health Care Education/Training Program

## 2018-02-25 ENCOUNTER — Other Ambulatory Visit: Payer: Self-pay

## 2018-02-25 ENCOUNTER — Encounter: Payer: Self-pay | Admitting: *Deleted

## 2018-02-25 DIAGNOSIS — L02414 Cutaneous abscess of left upper limb: Secondary | ICD-10-CM | POA: Insufficient documentation

## 2018-02-25 DIAGNOSIS — F1990 Other psychoactive substance use, unspecified, uncomplicated: Secondary | ICD-10-CM

## 2018-02-25 DIAGNOSIS — L0291 Cutaneous abscess, unspecified: Secondary | ICD-10-CM

## 2018-02-25 DIAGNOSIS — F1110 Opioid abuse, uncomplicated: Secondary | ICD-10-CM | POA: Insufficient documentation

## 2018-02-25 LAB — CBC WITH DIFFERENTIAL/PLATELET
Basophils Absolute: 0 10*3/uL (ref 0–0.1)
Basophils Relative: 1 %
Eosinophils Absolute: 0.1 10*3/uL (ref 0–0.7)
Eosinophils Relative: 2 %
HEMATOCRIT: 31.2 % — AB (ref 40.0–52.0)
HEMOGLOBIN: 10.5 g/dL — AB (ref 13.0–18.0)
LYMPHS ABS: 1.5 10*3/uL (ref 1.0–3.6)
Lymphocytes Relative: 32 %
MCH: 25.5 pg — AB (ref 26.0–34.0)
MCHC: 33.7 g/dL (ref 32.0–36.0)
MCV: 75.6 fL — AB (ref 80.0–100.0)
Monocytes Absolute: 0.4 10*3/uL (ref 0.2–1.0)
Monocytes Relative: 10 %
NEUTROS PCT: 55 %
Neutro Abs: 2.6 10*3/uL (ref 1.4–6.5)
Platelets: 185 10*3/uL (ref 150–440)
RBC: 4.13 MIL/uL — AB (ref 4.40–5.90)
RDW: 12.9 % (ref 11.5–14.5)
WBC: 4.6 10*3/uL (ref 3.8–10.6)

## 2018-02-25 LAB — COMPREHENSIVE METABOLIC PANEL
ALT: 15 U/L (ref 0–44)
AST: 16 U/L (ref 15–41)
Albumin: 3.2 g/dL — ABNORMAL LOW (ref 3.5–5.0)
Alkaline Phosphatase: 87 U/L (ref 38–126)
Anion gap: 5 (ref 5–15)
BUN: 15 mg/dL (ref 6–20)
CO2: 29 mmol/L (ref 22–32)
Calcium: 8.1 mg/dL — ABNORMAL LOW (ref 8.9–10.3)
Chloride: 102 mmol/L (ref 98–111)
Creatinine, Ser: 0.73 mg/dL (ref 0.61–1.24)
GFR calc Af Amer: >60 mL/min (ref >60)
Glucose, Bld: 120 mg/dL — ABNORMAL HIGH (ref 70–99)
Potassium: 3.7 mmol/L (ref 3.5–5.1)
Sodium: 136 mmol/L (ref 135–145)
Total Bilirubin: 0.3 mg/dL (ref 0.3–1.2)
Total Protein: 6.4 g/dL — ABNORMAL LOW (ref 6.5–8.1)

## 2018-02-25 MED ORDER — SODIUM CHLORIDE 0.9 % IV BOLUS
1000.0000 mL | Freq: Once | INTRAVENOUS | Status: AC
  Administered 2018-02-25: 1000 mL via INTRAVENOUS

## 2018-02-25 MED ORDER — SULFAMETHOXAZOLE-TRIMETHOPRIM 800-160 MG PO TABS: 1.0000 | ORAL_TABLET | Freq: Two times a day (BID) | ORAL | 0 refills | Status: DC

## 2018-02-25 MED ORDER — CEPHALEXIN 500 MG PO CAPS: 500.0000 mg | ORAL_CAPSULE | Freq: Three times a day (TID) | ORAL | 0 refills | Status: DC

## 2018-02-25 MED ORDER — PIPERACILLIN-TAZOBACTAM 3.375 G IVPB 30 MIN
3.3750 g | Freq: Once | INTRAVENOUS | Status: AC
  Administered 2018-02-25: 3.375 g via INTRAVENOUS
  Filled 2018-02-25: qty 50

## 2018-02-25 MED ORDER — LIDOCAINE HCL (PF) 1 % IJ SOLN
5.0000 mL | Freq: Once | INTRAMUSCULAR | Status: AC
  Administered 2018-02-25: 5 mL via INTRADERMAL
  Filled 2018-02-25: qty 5

## 2018-02-25 NOTE — Discharge Instructions (Addendum)
Follow-up with your regular doctor or return emergency department in 2 days for packing removal and wound recheck.  Do not inject any drugs into this area.  Take the antibiotics as prescribed.  Return if worsening.

## 2018-02-25 NOTE — ED Triage Notes (Signed)
Pt has abscess to left forearm.  Sx for 5 days.  Area red and tender to touch.  No drainage.  Pt alert

## 2018-02-25 NOTE — ED Notes (Signed)
See triage note  Presents with 2 abscess areas to left arm  States he developed area about 5 days ago

## 2018-02-25 NOTE — ED Provider Notes (Signed)
[facility] Emergency Department Provider Note  ____________________________________________   First MD Initiated Contact with Patient 02/25/18 1653     (approximate)  I have reviewed the triage vital signs and the nursing notes.   HISTORY  Chief Complaint Abscess    HPI Patrick Higgins is a 37 y.o. male presents emergency department complaining of abscesses on the left forearm.  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  He is unsure if he tried to inject himself in the left arm as he usually uses the right.  He denies any fever or chills.  Denies chest pain or shortness of breath.  He states that the one closer to his wrist is more painful than the other.    Past Medical History:  Diagnosis Date  . Heart murmur     There are no active problems to display for this patient.   History reviewed. No pertinent surgical history.  Prior to Admission medications   Medication Sig Start Date End Date Taking? Authorizing Provider  cephALEXin (KEFLEX) 500 MG capsule Take 1 capsule (500 mg total) by mouth 3 (three) times daily. 02/25/18   Fisher, Roselyn BeringSusan W, PA-C  sulfamethoxazole-trimethoprim (BACTRIM DS,SEPTRA DS) 800-160 MG tablet Take 1 tablet by mouth 2 (two) times daily. 02/25/18   Faythe GheeFisher, Susan W, PA-C    Allergies Hydrocodone  No family history on file.  Social History Social History   Tobacco Use  . Smoking status: Heavy Tobacco Smoker    Packs/day: 1.00    Types: Cigarettes  . Smokeless tobacco: Never Used  Substance Use Topics  . Alcohol use: Yes    Comment: occ  . Drug use: Yes    Comment: herion    Review of Systems  Constitutional: No fever/chills Eyes: No visual changes. ENT: No sore throat. Respiratory: Denies cough Genitourinary: Negative for dysuria. Musculoskeletal: Negative for back pain. Skin: Negative for rash.  Positive for abscesses on the left  forearm    ____________________________________________   PHYSICAL EXAM:  VITAL SIGNS: ED Triage Vitals  Enc Vitals Group     BP 02/25/18 1623 (!) 143/76     Pulse Rate 02/25/18 1623 78     Resp 02/25/18 1623 18     Temp 02/25/18 1623 98.1 F (36.7 C)     Temp Source 02/25/18 1623 Oral     SpO2 02/25/18 1623 100 %     Weight 02/25/18 1624 180 lb (81.6 kg)     Height 02/25/18 1624 6\' 3"  (1.905 m)     Head Circumference --      Peak Flow --      Pain Score 02/25/18 1623 6     Pain Loc --      Pain Edu? --      Excl. in GC? --     Constitutional: Alert and oriented. Well appearing and in no acute distress.  Afebrile Eyes: Conjunctivae are normal.  Head: Atraumatic. Nose: No congestion/rhinnorhea. Mouth/Throat: Mucous membranes are moist.   Neck:  supple no lymphadenopathy noted Cardiovascular: Normal rate, regular rhythm. Heart sounds are normal Respiratory: Normal respiratory effort.  No retractions, lungs c t a  GU: deferred Musculoskeletal: FROM all extremities, warm and well perfused.  No bony tenderness is noted in the left forearm Neurologic:  Normal speech and language.  Skin:  Skin is warm, dry and intact. No rash noted.  Positive for 2 large abscesses along the veins of the left forearm typical of abscesses  from IV drug use.  There is no pus or drainage noted at this time.  The one near the wrist is a little more fluctuant than the one on the antecubital. Psychiatric: Mood and affect are normal. Speech and behavior are normal.  ____________________________________________   LABS (all labs ordered are listed, but only abnormal results are displayed)  Labs Reviewed  COMPREHENSIVE METABOLIC PANEL - Abnormal; Notable for the following components:      Result Value   Glucose, Bld 120 (*)    Calcium 8.1 (*)    Total Protein 6.4 (*)    Albumin 3.2 (*)    All other components within normal limits  CBC WITH DIFFERENTIAL/PLATELET - Abnormal; Notable for the  following components:   RBC 4.13 (*)    Hemoglobin 10.5 (*)    HCT 31.2 (*)    MCV 75.6 (*)    MCH 25.5 (*)    All other components within normal limits  CULTURE, BLOOD (ROUTINE X 2)  CULTURE, BLOOD (ROUTINE X 2)   ____________________________________________   ____________________________________________  RADIOLOGY    ____________________________________________   PROCEDURES  Procedure(s) performed: Saline lock, Zosyn  .Marland KitchenIncision and Drainage Date/Time: 02/25/2018 8:56 PM Performed by: Faythe Ghee, PA-C Authorized by: Faythe Ghee, PA-C   Consent:    Consent obtained:  Verbal   Consent given by:  Patient   Risks discussed:  Bleeding, incomplete drainage, pain and infection   Alternatives discussed:  No treatment Location:    Type:  Abscess   Location:  Upper extremity   Upper extremity location:  Arm   Arm location:  L lower arm Pre-procedure details:    Skin preparation:  Betadine Anesthesia (see MAR for exact dosages):    Anesthesia method:  Local infiltration   Local anesthetic:  Lidocaine 1% w/o epi Procedure type:    Complexity:  Simple Procedure details:    Incision types:  Single straight   Incision depth:  Dermal   Scalpel blade:  11   Wound management:  Irrigated with saline   Drainage:  Bloody and purulent   Drainage amount:  Scant   Wound treatment:  Drain placed   Packing materials:  1/4 in iodoform gauze Post-procedure details:    Patient tolerance of procedure:  Tolerated well, no immediate complications      ____________________________________________   INITIAL IMPRESSION / ASSESSMENT AND PLAN / ED COURSE  Pertinent labs & imaging results that were available during my care of the patient were reviewed by me and considered in my medical decision making (see chart for details).   Patient is 37 year old male presents emergency department complaining of 2 abscesses on the left arm.  Both abscesses are noted to be in areas where  an IV drug user would try to insert a needle.  When questioned about this the patient does admit to using IV heroin.  He states he is unsure if he tried to use it in the left arm that he usually uses the right arm.  He denies fever or chills.  Denies chest pain or shortness of breath.  On physical exam the left forearm has a abscess about the size of half of a golf ball noted on the dorsum of the wrist area, a larger marble sized abscess noted in the antecubital area.  The one in the antecubital area is very hard.  The one at the wrist is more fluctuant.  Neurovascular is intact.   CBC has WBC of 4.6, comprehensive metabolic panel has  glucose of 120, calcium of 8.1, blood cultures are pending  Explained the lab results to the patient.  Told him these were typical results of an IV drug user.  We had a lengthy discussion of what can happen to him if he continues to use IV drugs.   The abscess at the left wrist was I&D with a very scant amount of pus and blood expelled.  The incision was deep.  Explained to the patient that some of the abscess in this area is due to the heroin infiltrating into the skin instead of the vein.  He states he understands.  He was given a prescription for Keflex and Septra.  He is to take these medications as prescribed.  When asking for pain medication I explained to him that since he is already using IV heroin I do not want to give him any narcotics that would mix with this drug.  He states he understands.  He will take Tylenol and ibuprofen as needed.  The area was dressed by nursing staff and the patient was discharged in stable condition.    As part of my medical decision making, I reviewed the following data within the electronic MEDICAL RECORD NUMBER Nursing notes reviewed and incorporated, Labs reviewed WBC of 4.6, comprehensive metabolic panel basically normal, Old chart reviewed, Notes from prior ED visits and [place] Controlled Substance  Database  ____________________________________________   FINAL CLINICAL IMPRESSION(S) / ED DIAGNOSES  Final diagnoses:  Abscess  IV drug user      NEW MEDICATIONS STARTED DURING THIS VISIT:  Discharge Medication List as of 02/25/2018  7:33 PM    START taking these medications   Details  cephALEXin (KEFLEX) 500 MG capsule Take 1 capsule (500 mg total) by mouth 3 (three) times daily., Starting Thu 02/25/2018, Normal    sulfamethoxazole-trimethoprim (BACTRIM DS,SEPTRA DS) 800-160 MG tablet Take 1 tablet by mouth 2 (two) times daily., Starting Thu 02/25/2018, Normal         Note:  This document was prepared using Dragon voice recognition software and may include unintentional dictation errors.    Faythe Ghee, PA-C 02/25/18 2101    Willy Eddy, MD 02/25/18 [id number]

## 2018-03-02 LAB — CULTURE, BLOOD (ROUTINE X 2): CULTURE: NO GROWTH

## 2018-03-04 LAB — CULTURE, BLOOD (ROUTINE X 2): Special Requests: ADEQUATE

## 2018-03-29 ENCOUNTER — Other Ambulatory Visit: Payer: Self-pay

## 2018-03-29 ENCOUNTER — Emergency Department
Admission: EM | Admit: 2018-03-29 | Discharge: 2018-03-30 | Disposition: A | Discharge status: Home / Self Care | Payer: Self-pay | Attending: Emergency Medicine | Admitting: Emergency Medicine

## 2018-03-29 DIAGNOSIS — R55 Syncope and collapse: Secondary | ICD-10-CM | POA: Insufficient documentation

## 2018-03-29 DIAGNOSIS — F1910 Other psychoactive substance abuse, uncomplicated: Secondary | ICD-10-CM

## 2018-03-29 DIAGNOSIS — T50901A Poisoning by unspecified drugs, medicaments and biological substances, accidental (unintentional), initial encounter: Secondary | ICD-10-CM

## 2018-03-29 DIAGNOSIS — F17210 Nicotine dependence, cigarettes, uncomplicated: Secondary | ICD-10-CM | POA: Insufficient documentation

## 2018-03-29 LAB — COMPREHENSIVE METABOLIC PANEL
ALK PHOS: 122 U/L (ref 38–126)
ALT: 258 U/L — AB (ref 0–44)
ANION GAP: 7 (ref 5–15)
AST: 136 U/L — ABNORMAL HIGH (ref 15–41)
Albumin: 4.3 g/dL (ref 3.5–5.0)
BUN: 23 mg/dL — ABNORMAL HIGH (ref 6–20)
CALCIUM: 8.9 mg/dL (ref 8.9–10.3)
CO2: 27 mmol/L (ref 22–32)
CREATININE: 1.05 mg/dL (ref 0.61–1.24)
Chloride: 106 mmol/L (ref 98–111)
Glucose, Bld: 166 mg/dL — ABNORMAL HIGH (ref 70–99)
Potassium: 3.7 mmol/L (ref 3.5–5.1)
SODIUM: 140 mmol/L (ref 135–145)
Total Bilirubin: 0.6 mg/dL (ref 0.3–1.2)
Total Protein: 8.1 g/dL (ref 6.5–8.1)

## 2018-03-29 LAB — CBC
HCT: 37.5 % — ABNORMAL LOW (ref 40.0–52.0)
HEMOGLOBIN: 12.6 g/dL — AB (ref 13.0–18.0)
MCH: 25.1 pg — AB (ref 26.0–34.0)
MCHC: 33.5 g/dL (ref 32.0–36.0)
MCV: 74.8 fL — AB (ref 80.0–100.0)
PLATELETS: 245 10*3/uL (ref 150–440)
RBC: 5.01 MIL/uL (ref 4.40–5.90)
RDW: 14.5 % (ref 11.5–14.5)
WBC: 8.4 10*3/uL (ref 3.8–10.6)

## 2018-03-29 MED ORDER — PROMETHAZINE HCL 25 MG/ML IJ SOLN
25.0000 mg | Freq: Once | INTRAMUSCULAR | Status: AC
  Administered 2018-03-29: 25 mg via INTRAVENOUS
  Filled 2018-03-29: qty 1

## 2018-03-29 MED ORDER — NALOXONE HCL 2 MG/2ML IJ SOSY
0.5000 mg | PREFILLED_SYRINGE | Freq: Once | INTRAMUSCULAR | Status: AC
  Administered 2018-03-29: 0.5 mg via INTRAVENOUS
  Filled 2018-03-29: qty 2

## 2018-03-29 MED ORDER — SODIUM CHLORIDE 0.9 % IV BOLUS
1000.0000 mL | Freq: Once | INTRAVENOUS | Status: AC
  Administered 2018-03-29: 1000 mL via INTRAVENOUS

## 2018-03-29 NOTE — ED Triage Notes (Signed)
Pt arrived via EMS after being found unresponsive in [employer] bathroom. Needles found in patient's pocket. Pt admits to using Xanax tonight.

## 2018-03-29 NOTE — ED Notes (Addendum)
Pt states that he feels "hot all over." Not lethargic at this time. MD notified.

## 2018-03-29 NOTE — ED Provider Notes (Addendum)
[facility] Emergency Department Provider Note  Time seen: 10:11 PM  I have reviewed the triage vital signs and the nursing notes.   HISTORY  Chief Complaint Loss of Consciousness    HPI Patrick Higgins is a 37 y.o. male with a past medical history of a heart murmur, substance abuse, presents to the emergency department after being found unresponsive at a gas station bathroom.  According EMS report they were called out to the gas station bathroom found the patient unresponsive in the bathroom and states he had needles in his pocket upon their arrival.  State the patient awoke with them very quickly was diaphoretic initially.  Was able to walk to get in the stretcher.  Patient was very nauseated and vomited several times in route to the hospital.  Given Zofran by EMS.  Patient denies any heroin use today but states he was using Xanax.  Does not know how much he states the little blue pill.  Patient is awake is alert he is oriented, he does have pinpoint pupils and he is slightly slurring his words.   Past Medical History:  Diagnosis Date  . Heart murmur     There are no active problems to display for this patient.   History reviewed. No pertinent surgical history.  Prior to Admission medications   Medication Sig Start Date End Date Taking? Authorizing Provider  cephALEXin (KEFLEX) 500 MG capsule Take 1 capsule (500 mg total) by mouth 3 (three) times daily. 02/25/18   Fisher, Roselyn BeringSusan W, PA-C  sulfamethoxazole-trimethoprim (BACTRIM DS,SEPTRA DS) 800-160 MG tablet Take 1 tablet by mouth 2 (two) times daily. 02/25/18   Faythe GheeFisher, Susan W, PA-C    Allergies  Allergen Reactions  . Hydrocodone Nausea And Vomiting    History reviewed. No pertinent family history.  Social History Social History   Tobacco Use  . Smoking status: Heavy Tobacco Smoker    Packs/day: 1.00    Types: Cigarettes  . Smokeless tobacco: Never Used  Substance Use Topics  . Alcohol use: Yes     Comment: occ  . Drug use: Yes    Comment: herion    Review of Systems Constitutional: Negative for fever. Cardiovascular: Negative for chest pain. Respiratory: Negative for shortness of breath. Gastrointestinal: Negative for abdominal pain.  Positive for nausea vomiting. Neurological: Negative for headache All other ROS negative  ____________________________________________   PHYSICAL EXAM:  Constitutional: Alert and oriented.  Appears nauseated, holding an emesis bag. Eyes: 1 to 2 mm pupils bilaterally. ENT   Head: Normocephalic and atraumatic.   Mouth/Throat: Mucous membranes are moist. Cardiovascular: Normal rate, regular rhythm. No murmur Respiratory: Normal respiratory effort without tachypnea nor retractions. Breath sounds are clear  Gastrointestinal: Soft and nontender. No distention.  Musculoskeletal: Nontender with normal range of motion in all extremities.  Neurologic:  Normal speech and language. No gross focal neurologic deficits Skin:  Skin is warm, dry and intact.  Psychiatric: Mood and affect are normal.  ____________________________________________   INITIAL IMPRESSION / ASSESSMENT AND PLAN / ED COURSE  Pertinent labs & imaging results that were available during my care of the patient were reviewed by me and considered in my medical decision making (see chart for details).  She presents to the emergency department after being found unresponsive in the bathroom at a gas station.  EMS reports there was several needles in the patient's pocket.  Police are here with the patient states he has active warrants for his arrest.  Currently the patient  is awake he is alert he is oriented but he is somewhat somnolent and is slurring his words.  We will check labs, IV hydrate, treat nausea.  Although patient denies heroin use tonight he was found unresponsive with needles in his pocket and he does appear to have pinpoint appearing pupils.  We will dose 0.5 mg of  Narcan and monitor for response.  Patient had significant response after Narcan.  Suspect likely polysubstance use.  Labs are largely nonrevealing.  Patient receiving IV fluids in the emergency department.  No longer feels nauseated.  We will monitor in the emergency department for approximately 1-2 more hours to ensure Narcan has worn off and the patient remains awake alert oriented.  At that time I believe the patient would be safe for discharge home.  Patient care signed out to oncoming provider.  CRITICAL CARE Performed by: Minna Antis   Total critical care time: 30 minutes  Critical care time was exclusive of separately billable procedures and treating other patients.  Critical care was necessary to treat or prevent imminent or life-threatening deterioration.  Critical care was time spent personally by me on the following activities: development of treatment plan with patient and/or surrogate as well as nursing, discussions with consultants, evaluation of patient's response to treatment, examination of patient, obtaining history from patient or surrogate, ordering and performing treatments and interventions, ordering and review of laboratory studies, ordering and review of radiographic studies, pulse oximetry and re-evaluation of patient's condition.   ____________________________________________   FINAL CLINICAL IMPRESSION(S) / ED DIAGNOSES  Unresponsiveness Drug overdose, accidental    Minna Antis, MD 03/29/18 1610    Minna Antis, MD 04/12/18 1640

## 2018-03-29 NOTE — ED Notes (Signed)
Lethargic

## 2018-03-30 MED ORDER — NALOXONE HCL 4 MG/0.1ML NA LIQD
1.0000 | Freq: Once | NASAL | Status: AC
  Administered 2018-03-30: 1 via NASAL

## 2018-03-30 MED ORDER — NALOXONE HCL 4 MG/0.1ML NA LIQD: 1.0000 | Freq: Once | NASAL | Status: DC

## 2018-03-30 MED ORDER — NALOXONE HCL 4 MG/0.1ML NA LIQD: NASAL | 1 refills | Status: DC

## 2018-03-30 MED ORDER — NALOXONE HCL 4 MG/0.1ML NA LIQD
NASAL | Status: AC
  Filled 2018-03-30: qty 4

## 2018-03-30 NOTE — ED Provider Notes (Signed)
-----------------------------------------   4:15 AM on 03/30/2018 -----------------------------------------  The patient is awake, alert, ambulatory, and wants to go.  Law enforcement has been advised that the patient is medically clear and ready for discharge.   Patrick Higgins, Maurizio Geno, MD 03/30/18 [phone]

## 2018-06-01 ENCOUNTER — Other Ambulatory Visit: Payer: Self-pay

## 2018-06-01 ENCOUNTER — Encounter: Payer: Self-pay | Admitting: Emergency Medicine

## 2018-06-01 ENCOUNTER — Emergency Department: Payer: Self-pay

## 2018-06-01 ENCOUNTER — Emergency Department
Admission: EM | Admit: 2018-06-01 | Discharge: 2018-06-02 | Disposition: A | Discharge status: Home / Self Care | Payer: Self-pay | Attending: Emergency Medicine | Admitting: Emergency Medicine

## 2018-06-01 DIAGNOSIS — F1410 Cocaine abuse, uncomplicated: Secondary | ICD-10-CM | POA: Insufficient documentation

## 2018-06-01 DIAGNOSIS — F1210 Cannabis abuse, uncomplicated: Secondary | ICD-10-CM | POA: Insufficient documentation

## 2018-06-01 DIAGNOSIS — T401X1A Poisoning by heroin, accidental (unintentional), initial encounter: Secondary | ICD-10-CM | POA: Insufficient documentation

## 2018-06-01 DIAGNOSIS — F17210 Nicotine dependence, cigarettes, uncomplicated: Secondary | ICD-10-CM | POA: Insufficient documentation

## 2018-06-01 LAB — ACETAMINOPHEN LEVEL: Acetaminophen (Tylenol), Serum: <10 ug/mL — ABNORMAL LOW (ref 10–30)

## 2018-06-01 LAB — COMPREHENSIVE METABOLIC PANEL
ALBUMIN: 4.5 g/dL (ref 3.5–5.0)
ALK PHOS: 104 U/L (ref 38–126)
ALT: 75 U/L — ABNORMAL HIGH (ref 0–44)
AST: 38 U/L (ref 15–41)
Anion gap: 8 (ref 5–15)
BILIRUBIN TOTAL: 0.5 mg/dL (ref 0.3–1.2)
BUN: 18 mg/dL (ref 6–20)
CO2: 26 mmol/L (ref 22–32)
Calcium: 8.7 mg/dL — ABNORMAL LOW (ref 8.9–10.3)
Chloride: 106 mmol/L (ref 98–111)
Creatinine, Ser: 1.08 mg/dL (ref 0.61–1.24)
GFR calc Af Amer: >60 mL/min (ref >60)
GFR calc non Af Amer: >60 mL/min (ref >60)
GLUCOSE: 99 mg/dL (ref 70–99)
POTASSIUM: 3.8 mmol/L (ref 3.5–5.1)
SODIUM: 140 mmol/L (ref 135–145)
TOTAL PROTEIN: 8.1 g/dL (ref 6.5–8.1)

## 2018-06-01 LAB — URINE DRUG SCREEN, QUALITATIVE (ARMC ONLY)
AMPHETAMINES, UR SCREEN: NOT DETECTED
BARBITURATES, UR SCREEN: NOT DETECTED
Benzodiazepine, Ur Scrn: NOT DETECTED
COCAINE METABOLITE, UR ~~LOC~~: POSITIVE — AB
Cannabinoid 50 Ng, Ur ~~LOC~~: POSITIVE — AB
MDMA (ECSTASY) UR SCREEN: NOT DETECTED
Methadone Scn, Ur: NOT DETECTED
OPIATE, UR SCREEN: POSITIVE — AB
Phencyclidine (PCP) Ur S: NOT DETECTED
TRICYCLIC, UR SCREEN: NOT DETECTED

## 2018-06-01 LAB — CBC
HEMATOCRIT: 40.3 % (ref 39.0–52.0)
HEMOGLOBIN: 12.9 g/dL — AB (ref 13.0–17.0)
MCH: 25.2 pg — ABNORMAL LOW (ref 26.0–34.0)
MCHC: 32 g/dL (ref 30.0–36.0)
MCV: 78.9 fL — ABNORMAL LOW (ref 80.0–100.0)
Platelets: 234 10*3/uL (ref 150–400)
RBC: 5.11 MIL/uL (ref 4.22–5.81)
RDW: 15.4 % (ref 11.5–15.5)
WBC: 12.1 10*3/uL — ABNORMAL HIGH (ref 4.0–10.5)
nRBC: 0 % (ref 0.0–0.2)

## 2018-06-01 LAB — SALICYLATE LEVEL: Salicylate Lvl: <7 mg/dL (ref 2.8–30.0)

## 2018-06-01 LAB — ETHANOL: Alcohol, Ethyl (B): <10 mg/dL (ref <10)

## 2018-06-01 MED ORDER — SODIUM CHLORIDE 0.9 % IV BOLUS
1000.0000 mL | Freq: Once | INTRAVENOUS | Status: AC
  Administered 2018-06-02: 1000 mL via INTRAVENOUS

## 2018-06-01 NOTE — ED Triage Notes (Signed)
Patient ambulatory to triage with steady gait, without difficulty or distress noted, [place] [facility] st "they found me passed out in the bathroom after I used heroin"; pt denies SI or HI but officer reports that he will be IVC, papers enroute

## 2018-06-01 NOTE — ED Provider Notes (Signed)
[facility] Emergency Department Provider Note   ____________________________________________   First MD Initiated Contact with Patient 06/01/18 2330     (approximate)  I have reviewed the triage vital signs and the nursing notes.   HISTORY  Chief Complaint Mental Health Problem    HPI Patrick Higgins is a 37 y.o. male brought to the ED via police under IVC for accidental heroin overdose.  Patient states he was just "trying to get high".  Denies active SI/HI/AH/VH.  Reportedly found him passed out in the bathroom after he accidentally overdosed.  Voices no medical complaints.  Specifically denies chest pain, shortness of breath, abdominal pain, nausea or vomiting.  Narcan was not given as patient awoke by himself.   Past Medical History:  Diagnosis Date  . Heart murmur     There are no active problems to display for this patient.   History reviewed. No pertinent surgical history.  Prior to Admission medications   Medication Sig Start Date End Date Taking? Authorizing Provider  cephALEXin (KEFLEX) 500 MG capsule Take 1 capsule (500 mg total) by mouth 3 (three) times daily. Patient not taking: Reported on 03/29/2018 02/25/18   Faythe Ghee, PA-C  naloxone [facility]) nasal spray 4 mg/0.1 mL Administer 1 spray as needed into one nostril in the case of decreased breathing and responsiveness thought to be due to narcotics overdose.  Call 911 and repeat dose every 2 to 3 minutes in alternating nostrils until EMS arrives. 03/30/18   Loleta Rose, MD  sulfamethoxazole-trimethoprim (BACTRIM DS,SEPTRA DS) 800-160 MG tablet Take 1 tablet by mouth 2 (two) times daily. Patient not taking: Reported on 03/29/2018 02/25/18   Faythe Ghee, PA-C    Allergies Hydrocodone  No family history on file.  Social History Social History   Tobacco Use  . Smoking status: Heavy Tobacco Smoker    Packs/day: 1.00    Types: Cigarettes  . Smokeless tobacco: Never Used    Substance Use Topics  . Alcohol use: Yes    Comment: occ  . Drug use: Yes    Types: Marijuana    Comment: herion    Review of Systems  Constitutional: No fever/chills Eyes: No visual changes. ENT: No sore throat. Cardiovascular: Denies chest pain. Respiratory: Denies shortness of breath. Gastrointestinal: No abdominal pain.  No nausea, no vomiting.  No diarrhea.  No constipation. Genitourinary: Negative for dysuria. Musculoskeletal: Negative for back pain. Skin: Negative for rash. Neurological: Negative for headaches, focal weakness or numbness. Psychiatric:Positive for accidental heroin overdose.  ____________________________________________   PHYSICAL EXAM:  VITAL SIGNS: ED Triage Vitals  Enc Vitals Group     BP 06/01/18 2318 (!) 145/78     Pulse Rate 06/01/18 2318 (!) 114     Resp 06/01/18 2318 20     Temp 06/01/18 2318 98 F (36.7 C)     Temp Source 06/01/18 2318 Oral     SpO2 06/01/18 2318 95 %     Weight 06/01/18 2317 200 lb (90.7 kg)     Height 06/01/18 2317 6\' 3"  (1.905 m)     Head Circumference --      Peak Flow --      Pain Score 06/01/18 2317 0     Pain Loc --      Pain Edu? --      Excl. in GC? --     Constitutional: Alert and oriented.  Disheveled appearing and in no acute distress. Eyes: Conjunctivae are normal. PERRL. EOMI. Head:  Atraumatic. Nose: No congestion/rhinnorhea. Mouth/Throat: Mucous membranes are moist.  Oropharynx non-erythematous. Neck: No stridor.  No cervical spine tenderness to palpation. Cardiovascular: Normal rate, regular rhythm. Grossly normal heart sounds.  Good peripheral circulation. Respiratory: Normal respiratory effort.  No retractions. Lungs CTAB. Gastrointestinal: Soft and nontender. No distention. No abdominal bruits. No CVA tenderness. Musculoskeletal: No lower extremity tenderness nor edema.  No joint effusions. Neurologic: Slightly drowsy but interactive on interview and examination.  Normal speech and  language. No gross focal neurologic deficits are appreciated. No gait instability. Skin:  Skin is warm, dry and intact. No rash noted. Psychiatric: Mood and affect are normal. Speech and behavior are normal.  ____________________________________________   LABS (all labs ordered are listed, but only abnormal results are displayed)  Labs Reviewed  COMPREHENSIVE METABOLIC PANEL - Abnormal; Notable for the following components:      Result Value   Calcium 8.7 (*)    ALT 75 (*)    All other components within normal limits  ACETAMINOPHEN LEVEL - Abnormal; Notable for the following components:   Acetaminophen (Tylenol), Serum <10 (*)    All other components within normal limits  CBC - Abnormal; Notable for the following components:   WBC 12.1 (*)    Hemoglobin 12.9 (*)    MCV 78.9 (*)    MCH 25.2 (*)    All other components within normal limits  URINE DRUG SCREEN, QUALITATIVE ([facility] ONLY) - Abnormal; Notable for the following components:   Cocaine Metabolite,Ur [place] POSITIVE (*)    Opiate, Ur [place] POSITIVE (*)    Cannabinoid 50 Ng, Ur [place] POSITIVE (*)    All other components within normal limits  ETHANOL  SALICYLATE LEVEL   ____________________________________________  EKG  ED ECG REPORT I, , J, the attending physician, personally viewed and interpreted this ECG.   Date: 06/02/2018  EKG Time: 0006  Rate: 103  Rhythm: sinus tachycardia  Axis: Normal  Intervals:none  ST&T Change: Nonspecific  ____________________________________________  RADIOLOGY  ED MD interpretation: No acute cardiopulmonary process  Official radiology report(s): Dg Chest 2 View  Result Date: 06/02/2018 CLINICAL DATA:  Drug overdose.  Loss of consciousness. EXAM: CHEST - 2 VIEW COMPARISON:  09/15/2013 FINDINGS: The heart size and mediastinal contours are within normal limits. Both lungs are clear. The visualized skeletal structures are unremarkable. IMPRESSION: Negative.  No active  cardiopulmonary disease. Electronically Signed   By: Myles RosenthalJohn  Stahl M.D.   On: 06/02/2018 00:34    ____________________________________________   PROCEDURES  Procedure(s) performed: None  Procedures  Critical Care performed: No  ____________________________________________   INITIAL IMPRESSION / ASSESSMENT AND PLAN / ED COURSE  As part of my medical decision making, I reviewed the following data within the electronic MEDICAL RECORD NUMBER Nursing notes reviewed and incorporated, Labs reviewed, EKG interpreted, Old chart reviewed, Radiograph reviewed, A consult was requested and obtained from this/these consultant(s) Psychiatry and Notes from prior ED visits   37 year old male brought in under IVC for accidental heroin overdose.  Reportedly patient refused to come to the hospital when he was found passed out and hence was placed under IVC.  Denies active SI/HI/AH/VH.  Will evaluate with screening toxicological lab work and urinalysis, initiate IV fluid resuscitation and [facility].  Clinical Course as of Jun 02 437  Wed Jun 02, 2018  0010 UDS remarkable for opiate, cocaine and cannabinoids.   [JS]  0140 Patient awaiting tele-psych interview.  4 mg IV Zofran given for emesis.   [JS]  0303 Patient  was evaluated by [facility] psychiatrist Dr. Marinda Elk who has rescinded his IVC and deems him psychiatrically stable for discharge home.  Patient is medically cleared and will be discharged home with his mother.  Strict return precautions given.  Patient verbalizes understanding and agrees with plan of care.   [JS]    Clinical Course User Index [JS] Irean Hong, MD     ____________________________________________   FINAL CLINICAL IMPRESSION(S) / ED DIAGNOSES  Final diagnoses:  Accidental overdose of heroin, initial encounter [facility])  Cocaine abuse (HCC)  Marijuana abuse     ED Discharge Orders    None       Note:  This document was prepared using Dragon voice recognition  software and may include unintentional dictation errors.    Irean Hong, MD 06/02/18 [phone]

## 2018-06-02 MED ORDER — ONDANSETRON HCL 4 MG/2ML IJ SOLN
INTRAMUSCULAR | Status: AC
  Filled 2018-06-02: qty 2

## 2018-06-02 MED ORDER — ONDANSETRON HCL 4 MG/2ML IJ SOLN
4.0000 mg | Freq: Once | INTRAMUSCULAR | Status: AC
  Administered 2018-06-02: 4 mg via INTRAVENOUS

## 2018-06-02 NOTE — ED Notes (Signed)
Resumed care from ann c rn.  Pt sleepy. Pt placed on monitor showing sinus tach.  Iv fluids infusing.

## 2018-06-02 NOTE — ED Notes (Signed)
Report off to butch rn  

## 2018-06-02 NOTE — Discharge Instructions (Addendum)
Call the resources provided if you wish to seek detox.  Return to the ER for worsening symptoms, persistent vomiting, lethargy, feelings of hurting yourself or others, or other concerns.

## 2018-06-02 NOTE — ED Notes (Signed)
SOC done. 

## 2018-06-02 NOTE — ED Notes (Signed)
Pt vomited liquid emesis after  drinking water.  meds given.   Sinus on monitor

## 2018-08-23 ENCOUNTER — Other Ambulatory Visit: Payer: Self-pay

## 2018-08-23 ENCOUNTER — Emergency Department
Admission: EM | Admit: 2018-08-23 | Discharge: 2018-08-24 | Disposition: A | Discharge status: Home / Self Care | Payer: Self-pay | Attending: Emergency Medicine | Admitting: Emergency Medicine

## 2018-08-23 DIAGNOSIS — F1210 Cannabis abuse, uncomplicated: Secondary | ICD-10-CM | POA: Insufficient documentation

## 2018-08-23 DIAGNOSIS — F17210 Nicotine dependence, cigarettes, uncomplicated: Secondary | ICD-10-CM | POA: Insufficient documentation

## 2018-08-23 DIAGNOSIS — T401X1A Poisoning by heroin, accidental (unintentional), initial encounter: Secondary | ICD-10-CM | POA: Insufficient documentation

## 2018-08-23 LAB — CBC
HEMATOCRIT: 40.8 % (ref 39.0–52.0)
Hemoglobin: 13.1 g/dL (ref 13.0–17.0)
MCH: 25.6 pg — ABNORMAL LOW (ref 26.0–34.0)
MCHC: 32.1 g/dL (ref 30.0–36.0)
MCV: 79.7 fL — ABNORMAL LOW (ref 80.0–100.0)
NRBC: 0 % (ref 0.0–0.2)
PLATELETS: 197 10*3/uL (ref 150–400)
RBC: 5.12 MIL/uL (ref 4.22–5.81)
RDW: 13.4 % (ref 11.5–15.5)
WBC: 4.9 10*3/uL (ref 4.0–10.5)

## 2018-08-23 LAB — COMPREHENSIVE METABOLIC PANEL
ALBUMIN: 4 g/dL (ref 3.5–5.0)
ALT: 84 U/L — ABNORMAL HIGH (ref 0–44)
ANION GAP: 6 (ref 5–15)
AST: 34 U/L (ref 15–41)
Alkaline Phosphatase: 90 U/L (ref 38–126)
BUN: 17 mg/dL (ref 6–20)
CHLORIDE: 107 mmol/L (ref 98–111)
CO2: 26 mmol/L (ref 22–32)
Calcium: 8.3 mg/dL — ABNORMAL LOW (ref 8.9–10.3)
Creatinine, Ser: 0.96 mg/dL (ref 0.61–1.24)
GFR calc Af Amer: >60 mL/min (ref >60)
Glucose, Bld: 112 mg/dL — ABNORMAL HIGH (ref 70–99)
POTASSIUM: 3.4 mmol/L — AB (ref 3.5–5.1)
Sodium: 139 mmol/L (ref 135–145)
Total Bilirubin: 0.5 mg/dL (ref 0.3–1.2)
Total Protein: 7.3 g/dL (ref 6.5–8.1)

## 2018-08-23 LAB — SALICYLATE LEVEL

## 2018-08-23 LAB — ETHANOL: Alcohol, Ethyl (B): <10 mg/dL (ref <10)

## 2018-08-23 LAB — ACETAMINOPHEN LEVEL

## 2018-08-23 NOTE — ED Triage Notes (Signed)
Pt arrived to ED via [facility] EMS with c/c of heroin overdose. [facility] reports pt [place]responsive [place] with pinpoint pupils. Given 2mg  narcan nasal spray at scene, pt became responsive. Pt admits to using a "bag" of heroin that was stronger than expected. Transport vitals: p90, r 18, 159/90, sats 98% on room air, CBG: 108. Pt arrives alert and oriented x3, actively vomiting. Able to respond to triage questions.

## 2018-08-23 NOTE — ED Provider Notes (Signed)
[facility] Emergency Department Provider Note ____________________________________________   First MD Initiated Contact with Patient 08/23/18 2300     (approximate)  I have reviewed the triage vital signs and the nursing notes.   HISTORY  Chief Complaint Drug Overdose  Level 5 caveat: History of present illness limited due to drug intoxication  HPI Patrick Higgins is a 38 y.o. male with history of heroin abuse who presents after an apparent accidental heroin overdose.  The patient states that he used a bag of heroin that he got from a friend and thinks it may have been more potent than what he has used previously.  He states "I think I took a bit too much."  Patient denies SI or HI and states he was not trying to harm himself.  He denies any medical symptoms except for nausea.  The patient was found unresponsive by EMS and was given Narcan in the field.  Past Medical History:  Diagnosis Date  . Heart murmur     There are no active problems to display for this patient.   No past surgical history on file.  Prior to Admission medications   Medication Sig Start Date End Date Taking? Authorizing Provider  naloxone [facility](NARCAN) nasal spray 4 mg/0.1 mL Administer 1 spray as needed into one nostril in the case of decreased breathing and responsiveness thought to be due to narcotics overdose.  Call 911 and repeat dose every 2 to 3 minutes in alternating nostrils until EMS arrives. 03/30/18   Loleta RoseForbach, Cory, MD    Allergies Hydrocodone  History reviewed. No pertinent family history.  Social History Social History   Tobacco Use  . Smoking status: Heavy Tobacco Smoker    Packs/day: 1.00    Types: Cigarettes  . Smokeless tobacco: Never Used  Substance Use Topics  . Alcohol use: Yes    Comment: occ  . Drug use: Yes    Types: Marijuana    Comment: herion    Review of Systems Level 5 caveat: Review of systems limited due to drug  intoxication Constitutional: No fever. Cardiovascular: Denies chest pain. Respiratory: Denies shortness of breath. Gastrointestinal: Positive for nausea. Musculoskeletal: Negative for back pain. Skin: Negative for rash. Neurological: Negative for headache.   ____________________________________________   PHYSICAL EXAM:  VITAL SIGNS: ED Triage Vitals  Enc Vitals Group     BP 08/23/18 2151 128/77     Pulse Rate 08/23/18 2151 96     Resp 08/23/18 2151 14     Temp --      Temp src --      SpO2 08/23/18 2151 98 %     Weight 08/23/18 2148 200 lb (90.7 kg)     Height 08/23/18 2148 6\' 3"  (1.905 m)     Head Circumference --      Peak Flow --      Pain Score 08/23/18 2152 0     Pain Loc --      Pain Edu? --      Excl. in GC? --     Constitutional: Somnolent but arousable to voice.  Oriented x3. Eyes: Conjunctivae are normal.  Small but reactive pupils. Head: Atraumatic. Nose: No congestion/rhinnorhea. Mouth/Throat: Mucous membranes are slightly dry.   Neck: Normal range of motion.  Cardiovascular: Normal rate, regular rhythm. Grossly normal heart sounds.  Good peripheral circulation. Respiratory: Normal respiratory effort.  No retractions. Lungs CTAB. Gastrointestinal: No distention.  Musculoskeletal: Extremities warm and well perfused.  Neurologic: Slurred speech.  Motor intact in all extremities.  No focal neurologic findings. Skin:  Skin is warm and dry. No rash noted. Psychiatric: Calm and cooperative.  ____________________________________________   LABS (all labs ordered are listed, but only abnormal results are displayed)  Labs Reviewed  COMPREHENSIVE METABOLIC PANEL - Abnormal; Notable for the following components:      Result Value   Potassium 3.4 (*)    Glucose, Bld 112 (*)    Calcium 8.3 (*)    ALT 84 (*)    All other components within normal limits  ACETAMINOPHEN LEVEL - Abnormal; Notable for the following components:   Acetaminophen (Tylenol), Serum <10  (*)    All other components within normal limits  CBC - Abnormal; Notable for the following components:   MCV 79.7 (*)    MCH 25.6 (*)    All other components within normal limits  ETHANOL  SALICYLATE LEVEL  URINE DRUG SCREEN, QUALITATIVE ([facility] ONLY)  CBG MONITORING, ED   ____________________________________________  EKG  ED ECG REPORT I, Dionne Bucy, the attending physician, personally viewed and interpreted this ECG.  Date: 08/23/2018 EKG Time: 2049 Rate: 96 Rhythm: normal sinus rhythm QRS Axis: Borderline right axis Intervals: normal ST/T Wave abnormalities: normal Narrative Interpretation: no evidence of acute ischemia  ____________________________________________  RADIOLOGY    ____________________________________________   PROCEDURES  Procedure(s) performed: No  Procedures  Critical Care performed: No ____________________________________________   INITIAL IMPRESSION / ASSESSMENT AND PLAN / ED COURSE  Pertinent labs & imaging results that were available during my care of the patient were reviewed by me and considered in my medical decision making (see chart for details).  38 year old male with history of heroin abuse presents after apparent accidental overdose.  The patient states that he used heroin given to him by a friend and thinks it may have been too strong for him.  He states that he snorted tonight and did not shoot up.  The patient also endorses marijuana use but none tonight.  He has no SI or HI, states he was not trying to harm himself.  I reviewed the past medical records in Epic.  The patient has been to the ED several times with accidental overdoses in the past,  Most recently in November of last year when he was placed under involuntary commitment but subsequently cleared by psychiatry.  Currently on exam the vital signs are normal.  The patient is somnolent but arousable to voice and able to answer most of my questions.  He has a good  respiratory effort and maintaining an O2 saturation in the mid to high 90s on room air.  Presentation is consistent with accidental heroin overdose.  At this time the patient does not need further Narcan although he is on the monitor and we will keep close watch on his vital signs and respiratory status.  I will plan to observe for additional 3 to 4 hours.  At this time there is no evidence of acute danger to self or others, other than the obvious risk associated with heroin abuse which the patient seems to understand adequately.  There is no indication to place him under commitment at this time.  ----------------------------------------- 3:47 AM on 08/24/2018 -----------------------------------------  The patient has now been in the ED for almost 6 hours.  He is awake and alert and his vital signs and respiratory status have been stable.  He is stable for discharge home at this time. ____________________________________________   FINAL CLINICAL IMPRESSION(S) / ED DIAGNOSES  Final diagnoses:  Accidental overdose of heroin, initial encounter [facility])      NEW MEDICATIONS STARTED DURING THIS VISIT:  New Prescriptions   No medications on file     Note:  This document was prepared using Dragon voice recognition software and may include unintentional dictation errors.    Dionne Bucy, MD 08/24/18 [phone]

## 2018-08-23 NOTE — ED Notes (Signed)
Attempted to collect urine specimen. Pt stated that he could not urinate at this time. Will try again in 20 minutes. Recommend in and out cath if pt still unable to urinate.

## 2019-04-05 ENCOUNTER — Other Ambulatory Visit: Payer: Self-pay

## 2019-04-05 ENCOUNTER — Emergency Department
Admission: EM | Admit: 2019-04-05 | Discharge: 2019-04-05 | Disposition: A | Discharge status: Home / Self Care | Payer: Self-pay | Attending: Emergency Medicine | Admitting: Emergency Medicine

## 2019-04-05 DIAGNOSIS — F17210 Nicotine dependence, cigarettes, uncomplicated: Secondary | ICD-10-CM | POA: Insufficient documentation

## 2019-04-05 DIAGNOSIS — T401X1A Poisoning by heroin, accidental (unintentional), initial encounter: Secondary | ICD-10-CM | POA: Insufficient documentation

## 2019-04-05 MED ORDER — NALOXONE HCL 4 MG/0.1ML NA LIQD: NASAL | 0 refills | Status: AC

## 2019-04-05 NOTE — ED Notes (Signed)
Md at bedside to eval patient.

## 2019-04-05 NOTE — ED Notes (Signed)
Labs drawn and sent -

## 2019-04-05 NOTE — ED Notes (Signed)
Patient sitting up in bed asking how long will he be here in ed. Completed meal provided 100%. Safety maintaned. Will monitor.

## 2019-04-05 NOTE — Discharge Instructions (Signed)
Please seek medical attention for any high fevers, chest pain, shortness of breath, change in behavior, persistent vomiting, bloody stool or any other new or concerning symptoms.  

## 2019-04-05 NOTE — ED Triage Notes (Signed)
Patient found on floor of bathroom restaurant. Fire dep administered 1 mg intranasal narcan. Patient awakened right away as per ems. Patient reports taking heroin. Patient walked into ED accompanied by ems. AOX$, denies pain /sob.

## 2019-04-05 NOTE — ED Provider Notes (Signed)
[facility] Emergency Department Provider Note  ____________________________________________   I have reviewed the triage vital signs and the nursing notes.   HISTORY  Chief Complaint Drug Overdose   History limited by: Not Limited   HPI Patrick Higgins is a 38 y.o. male who presents to the emergency department today because of concerns for opioid overdose.  Patient states that he had not used in about 2 weeks.  He got his hands on 2 bags of heroin today.  He states he took one bag and did not feel much so took it the second one.  He was found on the bathroom area restaurant.  He was given Narcan on the scene.  Patient denies any shortness of breath or chest pain.  He denies any recent illness.  Records reviewed. Per medical record review patient has a multiple ER visits for overdose.   Past Medical History:  Diagnosis Date  . Heart murmur     There are no active problems to display for this patient.   No past surgical history on file.  Prior to Admission medications   Medication Sig Start Date End Date Taking? Authorizing Provider  naloxone [facility]) nasal spray 4 mg/0.1 mL Administer 1 spray as needed into one nostril in the case of decreased breathing and responsiveness thought to be due to narcotics overdose.  Call 911 and repeat dose every 2 to 3 minutes in alternating nostrils until EMS arrives. 03/30/18   Hinda Kehr, MD    Allergies Hydrocodone  No family history on file.  Social History Social History   Tobacco Use  . Smoking status: Heavy Tobacco Smoker    Packs/day: 1.00    Types: Cigarettes  . Smokeless tobacco: Never Used  Substance Use Topics  . Alcohol use: Yes    Comment: occ  . Drug use: Yes    Types: Marijuana    Comment: herion    Review of Systems Constitutional: No fever/chills Eyes: No visual changes. ENT: No sore throat. Cardiovascular: Denies chest pain. Respiratory: Denies shortness of  breath. Gastrointestinal: No abdominal pain.  No nausea, no vomiting.  No diarrhea.   Genitourinary: Negative for dysuria. Musculoskeletal: Negative for back pain. Skin: Negative for rash. Neurological: Negative for headaches, focal weakness or numbness.  ____________________________________________   PHYSICAL EXAM:  VITAL SIGNS: ED Triage Vitals  Enc Vitals Group     BP 04/05/19 1538 (!) 159/89     Pulse Rate 04/05/19 1538 (!) 105     Resp 04/05/19 1538 16     Temp 04/05/19 1538 97.8 F (36.6 C)     Temp Source 04/05/19 1538 Oral     SpO2 --      Weight 04/05/19 1539 190 lb (86.2 kg)     Height 04/05/19 1539 6\' 1"  (1.854 m)     Head Circumference --      Peak Flow --      Pain Score 04/05/19 1539 0   Constitutional: Alert and oriented.  Eyes: Conjunctivae are normal.  ENT      Head: Normocephalic and atraumatic.      Nose: No congestion/rhinnorhea.      Mouth/Throat: Mucous membranes are moist.      Neck: No stridor. Cardiovascular: Normal rate, regular rhythm.  No murmurs, rubs, or gallops.  Respiratory: Normal respiratory effort without tachypnea nor retractions. Breath sounds are clear and equal bilaterally. No wheezes/rales/rhonchi. Gastrointestinal: Soft and non tender. No rebound. No guarding.  Genitourinary: Deferred Musculoskeletal: Normal range of motion  in all extremities Neurologic:  Normal speech and language. No gross focal neurologic deficits are appreciated.  Skin:  Skin is warm, dry and intact. No rash noted. Psychiatric: Mood and affect are normal. Speech and behavior are normal. Patient exhibits appropriate insight and judgment.  ____________________________________________    LABS (pertinent positives/negatives)  None  ____________________________________________   EKG  I, Phineas Semen, attending physician, personally viewed and interpreted this EKG  EKG Time: 1544 Rate: 92 Rhythm: normal sinus rhythm Axis: normal Intervals: qtc  447 QRS: narrow ST changes: no st elevation Impression: normal ekg  ____________________________________________    RADIOLOGY  None ____________________________________________   PROCEDURES  Procedures  ____________________________________________   INITIAL IMPRESSION / ASSESSMENT AND PLAN / ED COURSE  Pertinent labs & imaging results that were available during my care of the patient were reviewed by me and considered in my medical decision making (see chart for details).   Patient brought to the emergency department today because of concern for opiate overdose.  Patient does admit to using heroin.  Patient completely awake and alert upon initial exam patient did not have any other concerning symptoms of respiratory depression or decreased responsiveness.  Will give patient prescription for Narcan.  Will give patient RTS information.  ____________________________________________   FINAL CLINICAL IMPRESSION(S) / ED DIAGNOSES  Final diagnoses:  Accidental overdose of heroin, initial encounter [facility])     Note: This dictation was prepared with Dragon dictation. Any transcriptional errors that result from this process are unintentional     Phineas Semen, MD 04/05/19 1729

## 2019-04-05 NOTE — ED Notes (Signed)
Patient reprts was not trying to hurt himself or anyone. He was getting high with his friends. He did 2 bags of heroin and was found on ground in bathroom at the restaurant he was at hanging out with his friends. Md to eval. egk obtained, labs drawn and sent. Safety maintained will monitor.

## 2019-08-25 IMAGING — CR DG ELBOW COMPLETE 3+V*L*
1 series · 4 of 4 positions shown · non-contrast
Comparison: None.

CLINICAL DATA: Dizziness with syncopal episode. Left elbow pain
after fall.

EXAM:
LEFT ELBOW - COMPLETE 3+ VIEW

[Series 1: dg elbow complete left (3+view) · 0.14mm/px · 4 of 4 slices shown]
[im 1/4]
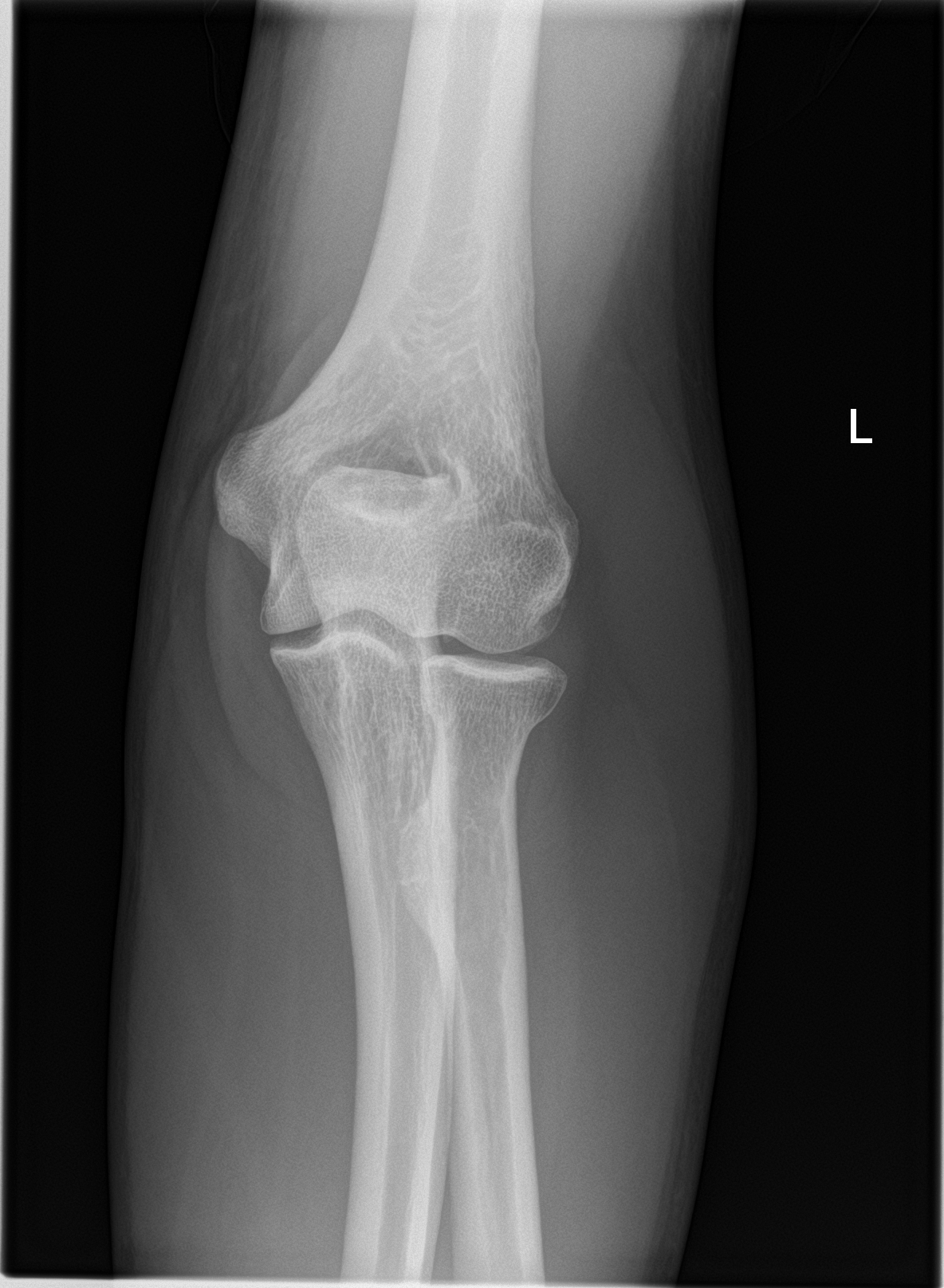
[im 2/4]
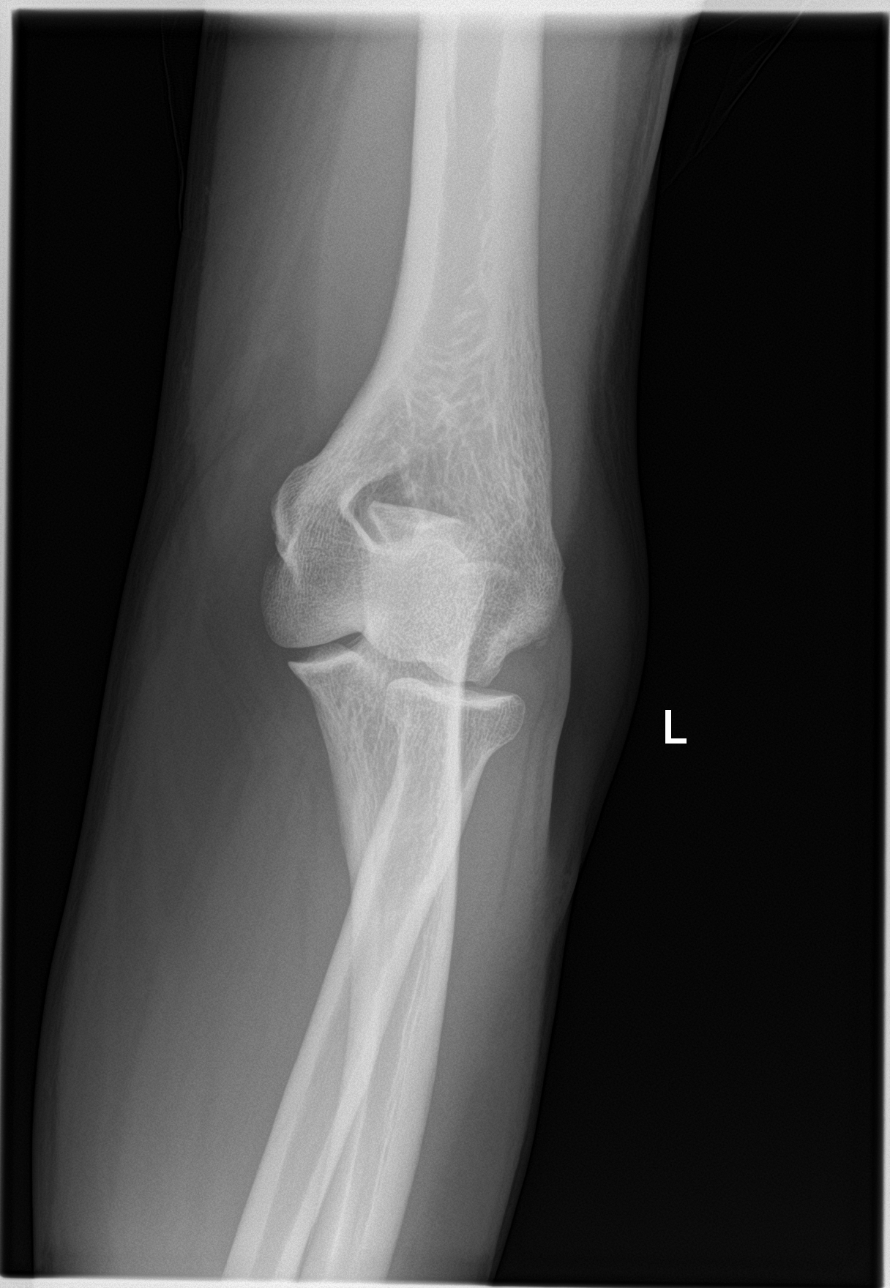
[im 3/4]
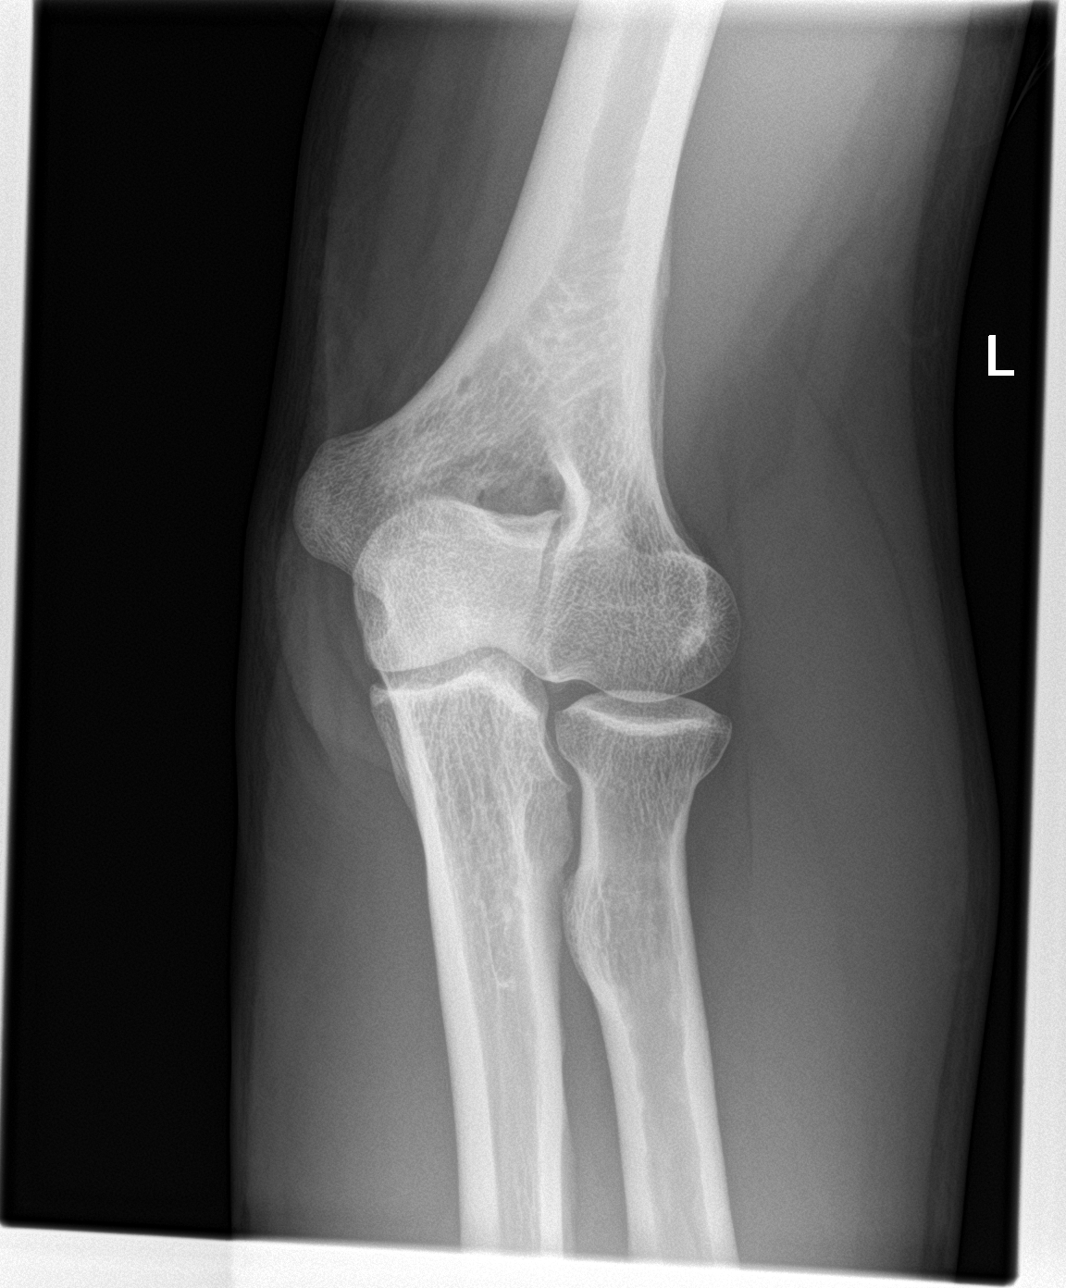
[im 4/4]
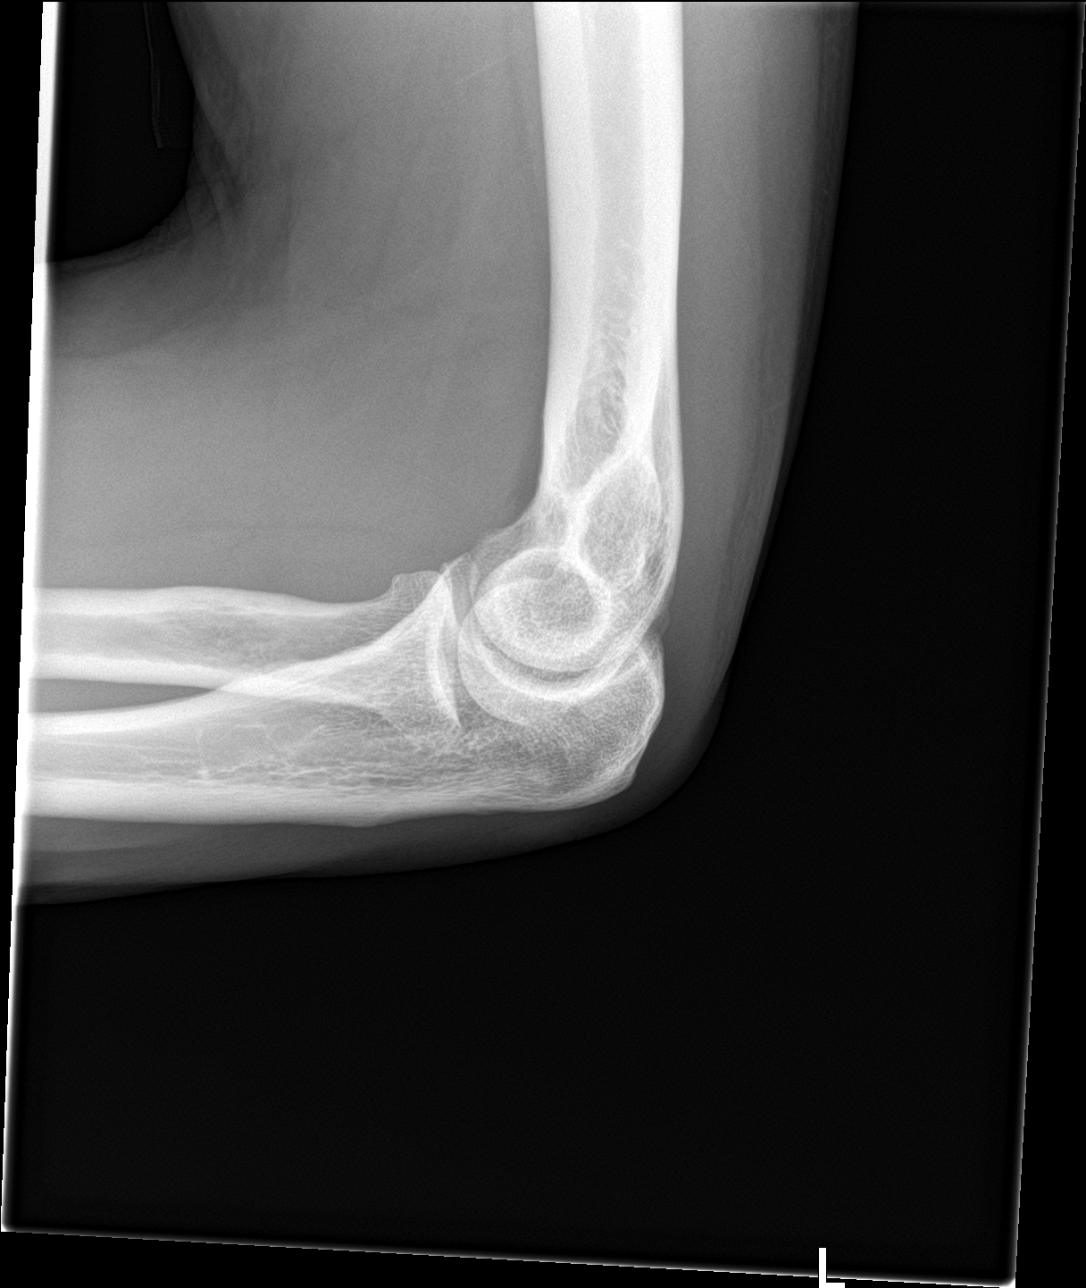

[4 of 4 positions shown; findings below may reference images not displayed]

FINDINGS: There is no evidence of fracture, dislocation, or joint effusion.
There minimal enthesopathy off the lateral epicondyle of the humerus
which can be seen with changes of chronic lateral epicondylitis.
Soft tissues are unremarkable.
IMPRESSION: No acute osseous abnormality of the left elbow. Minimal enthesopathy
off the lateral epicondyle humerus that may represent stigmata of
lateral epicondylitis.

## 2020-01-09 ENCOUNTER — Emergency Department
Admission: EM | Admit: 2020-01-09 | Discharge: 2020-01-09 | Disposition: A | Discharge status: Home / Self Care | Payer: Self-pay | Attending: Emergency Medicine | Admitting: Emergency Medicine

## 2020-01-09 ENCOUNTER — Other Ambulatory Visit: Payer: Self-pay

## 2020-01-09 ENCOUNTER — Encounter: Payer: Self-pay | Admitting: Emergency Medicine

## 2020-01-09 DIAGNOSIS — F17210 Nicotine dependence, cigarettes, uncomplicated: Secondary | ICD-10-CM | POA: Insufficient documentation

## 2020-01-09 DIAGNOSIS — M62830 Muscle spasm of back: Secondary | ICD-10-CM | POA: Insufficient documentation

## 2020-01-09 DIAGNOSIS — R350 Frequency of micturition: Secondary | ICD-10-CM | POA: Insufficient documentation

## 2020-01-09 LAB — CBC
HCT: 34.7 % — ABNORMAL LOW (ref 39.0–52.0)
Hemoglobin: 11.8 g/dL — ABNORMAL LOW (ref 13.0–17.0)
MCH: 24.9 pg — ABNORMAL LOW (ref 26.0–34.0)
MCHC: 34 g/dL (ref 30.0–36.0)
MCV: 73.4 fL — ABNORMAL LOW (ref 80.0–100.0)
Platelets: 224 10*3/uL (ref 150–400)
RBC: 4.73 MIL/uL (ref 4.22–5.81)
RDW: 13.5 % (ref 11.5–15.5)
WBC: 11.8 10*3/uL — ABNORMAL HIGH (ref 4.0–10.5)
nRBC: 0 % (ref 0.0–0.2)

## 2020-01-09 LAB — URINALYSIS, COMPLETE (UACMP) WITH MICROSCOPIC
Bacteria, UA: NONE SEEN
Bilirubin Urine: NEGATIVE
Glucose, UA: NEGATIVE mg/dL
Hgb urine dipstick: NEGATIVE
Ketones, ur: NEGATIVE mg/dL
Leukocytes,Ua: NEGATIVE
Nitrite: NEGATIVE
Protein, ur: 30 mg/dL — AB
Specific Gravity, Urine: 1.021 (ref 1.005–1.030)
Squamous Epithelial / LPF: NONE SEEN (ref 0–5)
pH: 8 (ref 5.0–8.0)

## 2020-01-09 LAB — BASIC METABOLIC PANEL
Anion gap: 10 (ref 5–15)
BUN: 9 mg/dL (ref 6–20)
CO2: 23 mmol/L (ref 22–32)
Calcium: 8.4 mg/dL — ABNORMAL LOW (ref 8.9–10.3)
Chloride: 98 mmol/L (ref 98–111)
Creatinine, Ser: 0.76 mg/dL (ref 0.61–1.24)
GFR calc Af Amer: >60 mL/min (ref >60)
GFR calc non Af Amer: >60 mL/min (ref >60)
Glucose, Bld: 141 mg/dL — ABNORMAL HIGH (ref 70–99)
Potassium: 3.7 mmol/L (ref 3.5–5.1)
Sodium: 131 mmol/L — ABNORMAL LOW (ref 135–145)

## 2020-01-09 MED ORDER — ONDANSETRON HCL 4 MG/2ML IJ SOLN: 4.0000 mg | Freq: Once | INTRAMUSCULAR | Status: DC

## 2020-01-09 MED ORDER — OXYCODONE-ACETAMINOPHEN 5-325 MG PO TABS
1.0000 | ORAL_TABLET | ORAL | Status: DC | PRN
  Administered 2020-01-09: 1 via ORAL
  Filled 2020-01-09: qty 1

## 2020-01-09 MED ORDER — METHOCARBAMOL 500 MG PO TABS: ORAL_TABLET | ORAL | 0 refills | Status: AC

## 2020-01-09 MED ORDER — OXYCODONE-ACETAMINOPHEN 5-325 MG PO TABS: 1.0000 | ORAL_TABLET | Freq: Four times a day (QID) | ORAL | 0 refills | Status: AC | PRN

## 2020-01-09 MED ORDER — ONDANSETRON 4 MG PO TBDP
4.0000 mg | ORAL_TABLET | Freq: Once | ORAL | Status: AC | PRN
  Administered 2020-01-09: 4 mg via ORAL
  Filled 2020-01-09: qty 1

## 2020-01-09 MED ORDER — LIDOCAINE 5 % EX PTCH
1.0000 | MEDICATED_PATCH | CUTANEOUS | Status: DC
  Administered 2020-01-09: 1 via TRANSDERMAL
  Filled 2020-01-09: qty 1

## 2020-01-09 MED ORDER — FENTANYL CITRATE (PF) 100 MCG/2ML IJ SOLN: 50.0000 ug | INTRAMUSCULAR | Status: DC | PRN

## 2020-01-09 MED ORDER — LIDOCAINE 5 % EX PTCH: 1.0000 | MEDICATED_PATCH | Freq: Two times a day (BID) | CUTANEOUS | 0 refills | Status: AC

## 2020-01-09 MED ORDER — ORPHENADRINE CITRATE 30 MG/ML IJ SOLN
60.0000 mg | Freq: Two times a day (BID) | INTRAMUSCULAR | Status: DC
  Administered 2020-01-09: 60 mg via INTRAMUSCULAR
  Filled 2020-01-09: qty 2

## 2020-01-09 NOTE — ED Triage Notes (Signed)
Here for acute onset left flank pain yesterday. Denies vomiting, hematuria, or fever.  Hx kidney stone. Appears in significant pain. VSS at this time.  Cannot sit still   Pt reports mother drove him here.

## 2020-01-09 NOTE — Discharge Instructions (Addendum)
Follow-up with your primary care provider if any continued problems or concerns.  You may also go to [facility] acute care.  You may use ice or heat to your back as needed for discomfort.  The Lidoderm patch that was applied to your back while in the ED can stay on your back for 12 hours.  A prescription for the same was sent to your pharmacy along with muscle relaxants.  The methocarbamol is 1 or 2 every 6 hours as needed for muscle spasms.  This medication in addition to the pain medication can cause drowsiness.  Do not drive or operate machinery while taking this medication.

## 2020-01-09 NOTE — ED Provider Notes (Signed)
[facility] Emergency Department Provider Note  ____________________________________________   First MD Initiated Contact with Patient 01/09/20 1542     (approximate)  I have reviewed the triage vital signs and the nursing notes.   HISTORY  Chief Complaint Flank Pain    HPI Patrick Higgins is a 39 y.o. male presents to the ED with complaint of right lower back pain.  Patient states that started yesterday and he complains of pain with urination.  He states "I think I have a kidney stone".  Patient denies any nausea, vomiting, hematuria or fever.  He states he has had a history of stones in the past.  He rates his pain as a 10/10.     Past Medical History:  Diagnosis Date   Heart murmur    Kidney stone     There are no problems to display for this patient.   History reviewed. No pertinent surgical history.  Prior to Admission medications   Medication Sig Start Date End Date Taking? Authorizing Provider  lidocaine (LIDODERM) 5 % Place 1 patch onto the skin every 12 (twelve) hours. Remove & Discard patch within 12 hours or as directed by MD 01/09/20 01/08/21  Tommi Rumps, PA-C  methocarbamol (ROBAXIN) 500 MG tablet 1 or 2 every 6 hours as needed for muscle spasms. 01/09/20   Tommi Rumps, PA-C  naloxone [facility]) nasal spray 4 mg/0.1 mL Administer 1 spray as needed into one nostril in the case of decreased breathing and responsiveness thought to be due to narcotics overdose.  Call 911 and repeat dose every 2 to 3 minutes in alternating nostrils until EMS arrives. 04/05/19   Phineas Semen, MD  oxyCODONE-acetaminophen (PERCOCET) 5-325 MG tablet Take 1 tablet by mouth every 6 (six) hours as needed for severe pain. 01/09/20   Tommi Rumps, PA-C    Allergies Hydrocodone  History reviewed. No pertinent family history.  Social History Social History   Tobacco Use   Smoking status: Heavy Tobacco Smoker    Packs/day: 1.00    Types: Cigarettes    Smokeless tobacco: Never Used  Substance Use Topics   Alcohol use: Yes    Comment: occ   Drug use: Yes    Types: Marijuana    Comment: herion    Review of Systems Constitutional: No fever/chills Eyes: No visual changes. ENT: No sore throat. Cardiovascular: Denies chest pain. Respiratory: Denies shortness of breath. Gastrointestinal: No abdominal pain.  No nausea, no vomiting.  No diarrhea.   Genitourinary: Negative for hematuria, frequency, urgency. Musculoskeletal: Positive for right flank pain. Skin: Negative for rash. Neurological: Negative for headaches, focal weakness or numbness. Psychiatric: Positive for polysubstance abuse. ____________________________________________   PHYSICAL EXAM:  VITAL SIGNS: ED Triage Vitals  Enc Vitals Group     BP 01/09/20 1438 133/81     Pulse Rate 01/09/20 1438 96     Resp 01/09/20 1438 20     Temp 01/09/20 1438 98.5 F (36.9 C)     Temp Source 01/09/20 1438 Oral     SpO2 01/09/20 1438 100 %     Weight 01/09/20 1439 180 lb (81.6 kg)     Height 01/09/20 1439 6\' 3"  (1.905 m)     Head Circumference --      Peak Flow --      Pain Score 01/09/20 1439 10     Pain Loc --      Pain Edu? --      Excl. in GC? --  Constitutional: Alert and oriented. Well appearing and in no acute distress. Eyes: Conjunctivae are normal. PERRL. EOMI. Head: Atraumatic. Nose: No congestion/rhinnorhea. Neck: No stridor.   Cardiovascular: Normal rate, regular rhythm. Grossly normal heart sounds.  Good peripheral circulation. Respiratory: Normal respiratory effort.  No retractions. Lungs CTAB. Gastrointestinal: Soft and nontender. No distention.  No CVA tenderness. Musculoskeletal: No point tenderness is noted on palpation of thoracic or lumbar spine however to the right paravertebral muscles there is an active muscle spasm that when palpated increases patient's pain.  Range of motion is decreased secondary to pain.  Good muscle strength bilaterally.   Patient's range of motion is guarded with patient lying on his side. Neurologic:  Normal speech and language. No gross focal neurologic deficits are appreciated. No gait instability. Skin:  Skin is warm, dry and intact. No rash noted. Psychiatric: Mood and affect are normal. Speech and behavior are normal.  ____________________________________________   LABS (all labs ordered are listed, but only abnormal results are displayed)  Labs Reviewed  URINALYSIS, COMPLETE (UACMP) WITH MICROSCOPIC - Abnormal; Notable for the following components:      Result Value   Color, Urine YELLOW (*)    APPearance CLEAR (*)    Protein, ur 30 (*)    All other components within normal limits  BASIC METABOLIC PANEL - Abnormal; Notable for the following components:   Sodium 131 (*)    Glucose, Bld 141 (*)    Calcium 8.4 (*)    All other components within normal limits  CBC - Abnormal; Notable for the following components:   WBC 11.8 (*)    Hemoglobin 11.8 (*)    HCT 34.7 (*)    MCV 73.4 (*)    MCH 24.9 (*)    All other components within normal limits    PROCEDURES  Procedure(s) performed (including Critical Care):  Procedures  ____________________________________________   INITIAL IMPRESSION / ASSESSMENT AND PLAN / ED COURSE  As part of my medical decision making, I reviewed the following data within the electronic MEDICAL RECORD NUMBER Notes from prior ED visits and [place] Controlled Substance Database  39 year old male presents to the ED with complaint of right low back pain that started yesterday.  Patient states that pain is increased with range of motion.  He denies any hematuria, vomiting, fever or chills.  On physical exam he was moderately tender on palpation of the right paravertebral muscles at L5-S1 area.  Range of motion is markedly reduced on exam secondary to pain.  Patient was given a Lidoderm patch, Norflex and Percocet while in the ED.  Patient was sleeping on his right side when provider  went to reevaluate.  Patient was able to get to a sitting position with less discomfort.  He is aware that a limited number of oxycodone-acetaminophen, methocarbamol would be sent to the pharmacy to help with his back pain.  A prescription for Lidoderm patches was sent to the pharmacy with instructions to replace every 12 hours if needed to his right lower back.  He is to follow-up with his PCP if any continued problems or return to the emergency department if any severe worsening of his symptoms. ____________________________________________   FINAL CLINICAL IMPRESSION(S) / ED DIAGNOSES  Final diagnoses:  Spasm of muscle of lower back     ED Discharge Orders         Ordered    oxyCODONE-acetaminophen (PERCOCET) 5-325 MG tablet  Every 6 hours PRN     Discontinue  Reprint  01/09/20 1740    methocarbamol (ROBAXIN) 500 MG tablet     Discontinue  Reprint     01/09/20 1740    lidocaine (LIDODERM) 5 %  Every 12 hours     Discontinue  Reprint     01/09/20 1740           Note:  This document was prepared using Dragon voice recognition software and may include unintentional dictation errors.    Tommi Rumps, PA-C 01/09/20 1840    Minna Antis, MD 01/09/20 2005

## 2020-01-09 NOTE — ED Notes (Signed)
See triage note, pt reports "I think I have a kidney stone". Pt reports right flank pain that started yesterday. Pt reports pain with urination.  Denies N/V.  Pt ambulatory to treatment room

## 2020-01-23 ENCOUNTER — Other Ambulatory Visit: Payer: Self-pay

## 2020-01-23 DIAGNOSIS — Z5321 Procedure and treatment not carried out due to patient leaving prior to being seen by health care provider: Secondary | ICD-10-CM | POA: Insufficient documentation

## 2020-01-23 DIAGNOSIS — M545 Low back pain: Secondary | ICD-10-CM | POA: Insufficient documentation

## 2020-01-23 DIAGNOSIS — L02414 Cutaneous abscess of left upper limb: Secondary | ICD-10-CM | POA: Insufficient documentation

## 2020-01-23 DIAGNOSIS — G8929 Other chronic pain: Secondary | ICD-10-CM | POA: Insufficient documentation

## 2020-01-23 LAB — CBC
HCT: 33 % — ABNORMAL LOW (ref 39.0–52.0)
Hemoglobin: 10.5 g/dL — ABNORMAL LOW (ref 13.0–17.0)
MCH: 23.8 pg — ABNORMAL LOW (ref 26.0–34.0)
MCHC: 31.8 g/dL (ref 30.0–36.0)
MCV: 74.8 fL — ABNORMAL LOW (ref 80.0–100.0)
Platelets: 482 10*3/uL — ABNORMAL HIGH (ref 150–400)
RBC: 4.41 MIL/uL (ref 4.22–5.81)
RDW: 13.7 % (ref 11.5–15.5)
WBC: 12.6 10*3/uL — ABNORMAL HIGH (ref 4.0–10.5)
nRBC: 0 % (ref 0.0–0.2)

## 2020-01-23 LAB — BASIC METABOLIC PANEL
Anion gap: 9 (ref 5–15)
BUN: 22 mg/dL — ABNORMAL HIGH (ref 6–20)
CO2: 27 mmol/L (ref 22–32)
Calcium: 8.7 mg/dL — ABNORMAL LOW (ref 8.9–10.3)
Chloride: 100 mmol/L (ref 98–111)
Creatinine, Ser: 0.75 mg/dL (ref 0.61–1.24)
GFR calc Af Amer: >60 mL/min (ref >60)
GFR calc non Af Amer: >60 mL/min (ref >60)
Glucose, Bld: 147 mg/dL — ABNORMAL HIGH (ref 70–99)
Potassium: 4 mmol/L (ref 3.5–5.1)
Sodium: 136 mmol/L (ref 135–145)

## 2020-01-23 NOTE — ED Triage Notes (Signed)
Pt arrives to ED via POV from home with c/o abscess in the left AC-area since Saturday. Pt arrives with a noticeable lump in the arm; admits to using IV Heroin. Pt also c/o chronic lower back pain without any recent injury or trauma. Pt denies N/V/D or fever; no c/o's CP or SHOB. Pt is A&O, in NAD; RR even, regular, and unlabored.

## 2020-01-24 ENCOUNTER — Emergency Department
Admission: EM | Admit: 2020-01-24 | Discharge: 2020-01-24 | Disposition: A | Discharge status: Home / Self Care | Payer: Self-pay | Attending: Emergency Medicine | Admitting: Emergency Medicine

## 2020-01-24 DIAGNOSIS — F17210 Nicotine dependence, cigarettes, uncomplicated: Secondary | ICD-10-CM | POA: Insufficient documentation

## 2020-01-24 DIAGNOSIS — L02414 Cutaneous abscess of left upper limb: Secondary | ICD-10-CM | POA: Insufficient documentation

## 2020-01-24 MED ORDER — LIDOCAINE HCL (PF) 1 % IJ SOLN
5.0000 mL | Freq: Once | INTRAMUSCULAR | Status: AC
  Administered 2020-01-24: 5 mL via INTRADERMAL
  Filled 2020-01-24: qty 5

## 2020-01-24 MED ORDER — SULFAMETHOXAZOLE-TRIMETHOPRIM 800-160 MG PO TABS
1.0000 | ORAL_TABLET | Freq: Once | ORAL | Status: AC
  Administered 2020-01-24: 1 via ORAL
  Filled 2020-01-24: qty 1

## 2020-01-24 MED ORDER — SULFAMETHOXAZOLE-TRIMETHOPRIM 800-160 MG PO TABS: 1.0000 | ORAL_TABLET | Freq: Two times a day (BID) | ORAL | 0 refills | Status: AC

## 2020-01-24 MED ORDER — IBUPROFEN 800 MG PO TABS
800.0000 mg | ORAL_TABLET | Freq: Once | ORAL | Status: AC
  Administered 2020-01-24: 800 mg via ORAL
  Filled 2020-01-24: qty 1

## 2020-01-24 NOTE — ED Notes (Signed)
Pt called from lobby with no reply °

## 2020-01-24 NOTE — ED Provider Notes (Signed)
[facility] Emergency Department Provider Note  ____________________________________________  Time seen: Approximately 10:14 PM  I have reviewed the triage vital signs and the nursing notes.   HISTORY  Chief Complaint Abscess   HPI Patrick Higgins is a 39 y.o. male presents to the emergency department for treatment and evaluation of abscess at the left [facility].  He is a frequent IV drug user but does not believe that he has used in that specific area recently.  He states that he noticed the area about a week ago.  He denies fever.  He was here last night but left before being seen.  No alleviating measures attempted prior to arrival.   Past Medical History:  Diagnosis Date   Heart murmur    Kidney stone     There are no problems to display for this patient.   No past surgical history on file.  Prior to Admission medications   Medication Sig Start Date End Date Taking? Authorizing Provider  lidocaine (LIDODERM) 5 % Place 1 patch onto the skin every 12 (twelve) hours. Remove & Discard patch within 12 hours or as directed by MD 01/09/20 01/08/21  Tommi Rumps, PA-C  methocarbamol (ROBAXIN) 500 MG tablet 1 or 2 every 6 hours as needed for muscle spasms. 01/09/20   Tommi Rumps, PA-C  naloxone [facility]) nasal spray 4 mg/0.1 mL Administer 1 spray as needed into one nostril in the case of decreased breathing and responsiveness thought to be due to narcotics overdose.  Call 911 and repeat dose every 2 to 3 minutes in alternating nostrils until EMS arrives. 04/05/19   Phineas Semen, MD  oxyCODONE-acetaminophen (PERCOCET) 5-325 MG tablet Take 1 tablet by mouth every 6 (six) hours as needed for severe pain. 01/09/20   Tommi Rumps, PA-C  sulfamethoxazole-trimethoprim (BACTRIM DS) 800-160 MG tablet Take 1 tablet by mouth 2 (two) times daily. 01/24/20   Kem Boroughs B, FNP    Allergies Hydrocodone  No family history on file.  Social History Social History    Tobacco Use   Smoking status: Heavy Tobacco Smoker    Packs/day: 1.00    Types: Cigarettes   Smokeless tobacco: Never Used  Substance Use Topics   Alcohol use: Yes    Comment: occ   Drug use: Yes    Types: Marijuana    Comment: herion    Review of Systems  Constitutional: Negative for fever. Respiratory: Negative for cough or shortness of breath.  Musculoskeletal: Negative for myalgias Skin: Positive for skin abscess Neurological: Negative for numbness or paresthesias. ____________________________________________   PHYSICAL EXAM:  VITAL SIGNS: ED Triage Vitals  Enc Vitals Group     BP 01/24/20 2133 (!) 138/91     Pulse Rate 01/24/20 2133 100     Resp 01/24/20 2133 16     Temp 01/24/20 2133 97.9 F (36.6 C)     Temp Source 01/24/20 2133 Oral     SpO2 01/24/20 2133 98 %     Weight 01/24/20 2134 180 lb (81.6 kg)     Height 01/24/20 2134 6\' 3"  (1.905 m)     Head Circumference --      Peak Flow --      Pain Score 01/24/20 2134 10     Pain Loc --      Pain Edu? --      Excl. in GC? --      Constitutional: Well appearing. Eyes: Conjunctivae are clear without discharge or drainage. Nose: No  rhinorrhea noted. Mouth/Throat: Airway is patent.  Neck: No stridor. Unrestricted range of motion observed. Cardiovascular: Capillary refill is <3 seconds.  Respiratory: Respirations are even and unlabored.. Musculoskeletal: Unrestricted range of motion observed. Neurologic: Awake, alert, and oriented x 4.  Skin: Abscess noted just below the left AC with fluctuance and induration.  ____________________________________________   LABS (all labs ordered are listed, but only abnormal results are displayed)  Labs Reviewed - No data to display ____________________________________________  EKG  Not indicated. ____________________________________________  RADIOLOGY  Not indicated ____________________________________________   PROCEDURES  .Marland KitchenIncision and  Drainage  Date/Time: 01/24/2020 11:18 PM Performed by: Chinita Pester, FNP Authorized by: Chinita Pester, FNP   Consent:    Consent obtained:  Verbal   Consent given by:  Patient   Risks discussed:  Bleeding, infection, incomplete drainage and pain   Alternatives discussed:  Alternative treatment, delayed treatment and observation Location:    Type:  Abscess   Location:  Upper extremity   Upper extremity location:  Arm   Arm location: left AC. Pre-procedure details:    Skin preparation:  Betadine Anesthesia (see MAR for exact dosages):    Anesthesia method:  Local infiltration   Local anesthetic:  Lidocaine 1% w/o epi Procedure type:    Complexity:  Complex Procedure details:    Incision types:  Single straight   Incision depth:  Dermal   Scalpel blade:  11   Wound management:  Probed and deloculated   Drainage:  Purulent and bloody   Drainage amount:  Copious   Wound treatment:  Drain placed   Packing materials:  1/4 in iodoform gauze Post-procedure details:    Patient tolerance of procedure:  Tolerated well, no immediate complications   ____________________________________________   INITIAL IMPRESSION / ASSESSMENT AND PLAN / ED COURSE  Patrick Higgins is a 39 y.o. male presenting to the emergency department for evaluation of abscess likely secondary to IV drug use.  Plan will be to do an I&D and pack the wound then place him on Bactrim and have him return in 2 days for wound check.  Incision and drainage performed as above.  Patient tolerated procedure well.  Wound care instructions and instructions to return in 2 days for wound recheck reviewed with the patient who agrees to the plan.   Medications  lidocaine (PF) (XYLOCAINE) 1 % injection 5 mL (has no administration in time range)  sulfamethoxazole-trimethoprim (BACTRIM DS) 800-160 MG per tablet 1 tablet (has no administration in time range)  ibuprofen (ADVIL) tablet 800 mg (has no administration in time range)      Pertinent labs & imaging results that were available during my care of the patient were reviewed by me and considered in my medical decision making (see chart for details).  ____________________________________________   FINAL CLINICAL IMPRESSION(S) / ED DIAGNOSES  Final diagnoses:  Abscess of forearm, left    ED Discharge Orders         Ordered    sulfamethoxazole-trimethoprim (BACTRIM DS) 800-160 MG tablet  2 times daily     Discontinue  Reprint     01/24/20 2309           Note:  This document was prepared using Dragon voice recognition software and may include unintentional dictation errors.   Chinita Pester, FNP 01/24/20 2322    Phineas Semen, MD 01/25/20 1524

## 2020-01-24 NOTE — ED Triage Notes (Signed)
Pt to ED with abscess to left arm near [facility] region. Pt admits to using IV drugs but denies having used in this specific area. Area is swollen but pt denies it being larger than last night when he originally came to ED pt reports he could not wait the wait time and left without being seen. No fevers, no streaking.

## 2020-01-24 NOTE — Discharge Instructions (Signed)
Return in 2 days for a wound check.  Change the dressing on your arm whenever it gets wet or dirty.  Do not pull out the packing.  If you notice that the area looks worse, return to the emergency department immediately.  Take the antibiotic as prescribed until finished.

## 2020-01-24 NOTE — ED Notes (Signed)
Pt called from lobby with no reply

## 2020-01-24 NOTE — ED Notes (Signed)
Pt unable to sign E-signature due to signature pad malfunction. Pt verbalized understanding of d/c instructions and had no additional questions or concerns for this RN or provider. Pt left with d/c instructions and gathered all personal belongings from room and removed them prior to ED departure.   

## 2020-04-18 ENCOUNTER — Emergency Department: Payer: Self-pay

## 2020-04-18 ENCOUNTER — Other Ambulatory Visit: Payer: Self-pay

## 2020-04-18 ENCOUNTER — Emergency Department
Admission: EM | Admit: 2020-04-18 | Discharge: 2020-04-19 | Disposition: A | Discharge status: Home / Self Care | Payer: Self-pay | Attending: Emergency Medicine | Admitting: Emergency Medicine

## 2020-04-18 DIAGNOSIS — F17210 Nicotine dependence, cigarettes, uncomplicated: Secondary | ICD-10-CM | POA: Insufficient documentation

## 2020-04-18 DIAGNOSIS — T401X1A Poisoning by heroin, accidental (unintentional), initial encounter: Secondary | ICD-10-CM | POA: Insufficient documentation

## 2020-04-18 LAB — CBC WITH DIFFERENTIAL/PLATELET
Abs Immature Granulocytes: 0.37 10*3/uL — ABNORMAL HIGH (ref 0.00–0.07)
Basophils Absolute: 0 10*3/uL (ref 0.0–0.1)
Basophils Relative: 1 %
Eosinophils Absolute: 0.1 10*3/uL (ref 0.0–0.5)
Eosinophils Relative: 1 %
HCT: 40.9 % (ref 39.0–52.0)
Hemoglobin: 12.1 g/dL — ABNORMAL LOW (ref 13.0–17.0)
Immature Granulocytes: 6 %
Lymphocytes Relative: 30 %
Lymphs Abs: 2 10*3/uL (ref 0.7–4.0)
MCH: 22.3 pg — ABNORMAL LOW (ref 26.0–34.0)
MCHC: 29.6 g/dL — ABNORMAL LOW (ref 30.0–36.0)
MCV: 75.5 fL — ABNORMAL LOW (ref 80.0–100.0)
Monocytes Absolute: 0.2 10*3/uL (ref 0.1–1.0)
Monocytes Relative: 3 %
Neutro Abs: 3.9 10*3/uL (ref 1.7–7.7)
Neutrophils Relative %: 59 %
Platelets: 398 10*3/uL (ref 150–400)
RBC: 5.42 MIL/uL (ref 4.22–5.81)
RDW: 19.3 % — ABNORMAL HIGH (ref 11.5–15.5)
Smear Review: NORMAL
WBC: 6.6 10*3/uL (ref 4.0–10.5)
nRBC: 0 % (ref 0.0–0.2)

## 2020-04-18 LAB — COMPREHENSIVE METABOLIC PANEL
ALT: 53 U/L — ABNORMAL HIGH (ref 0–44)
AST: 51 U/L — ABNORMAL HIGH (ref 15–41)
Albumin: 3.6 g/dL (ref 3.5–5.0)
Alkaline Phosphatase: 114 U/L (ref 38–126)
Anion gap: 15 (ref 5–15)
BUN: 22 mg/dL — ABNORMAL HIGH (ref 6–20)
CO2: 23 mmol/L (ref 22–32)
Calcium: 8.4 mg/dL — ABNORMAL LOW (ref 8.9–10.3)
Chloride: 101 mmol/L (ref 98–111)
Creatinine, Ser: 1.2 mg/dL (ref 0.61–1.24)
GFR, Estimated: >60 mL/min (ref >60)
Glucose, Bld: 279 mg/dL — ABNORMAL HIGH (ref 70–99)
Potassium: 4.1 mmol/L (ref 3.5–5.1)
Sodium: 139 mmol/L (ref 135–145)
Total Bilirubin: 0.5 mg/dL (ref 0.3–1.2)
Total Protein: 7.6 g/dL (ref 6.5–8.1)

## 2020-04-18 LAB — ACETAMINOPHEN LEVEL: Acetaminophen (Tylenol), Serum: <10 ug/mL — ABNORMAL LOW (ref 10–30)

## 2020-04-18 LAB — ETHANOL: Alcohol, Ethyl (B): <10 mg/dL (ref <10)

## 2020-04-18 LAB — SALICYLATE LEVEL: Salicylate Lvl: <7 mg/dL — ABNORMAL LOW (ref 7.0–30.0)

## 2020-04-18 NOTE — ED Provider Notes (Signed)
[facility] Emergency Department Provider Note  ____________________________________________   First MD Initiated Contact with Patient 04/18/20 2212     (approximate)  I have reviewed the triage vital signs and the nursing notes.   HISTORY  Chief Complaint Drug Overdose    HPI Patrick Higgins is a 39 y.o. male  With h/o chronic opiate abuse here with overdose. History limited 2/2 intoxication. Per report, pt was found by his family at home acutely overdosed, unresponsive. Narcan given by family. Pt breathing spont on EMS arrival but has refused to answer questions. Pt tells me the OD was accidental, denies SI. However, per EMS report, pt mother was on way to ED to IVC. He has tinnitus which has happened w. Narcan in the past. No focal numbness or weakness.  Level 5 caveat invoked as remainder of history, ROS, and physical exam limited due to patient's intoxication/overdose.         Past Medical History:  Diagnosis Date  . Heart murmur   . Kidney stone     There are no problems to display for this patient.   History reviewed. No pertinent surgical history.  Prior to Admission medications   Medication Sig Start Date End Date Taking? Authorizing Provider  lidocaine (LIDODERM) 5 % Place 1 patch onto the skin every 12 (twelve) hours. Remove & Discard patch within 12 hours or as directed by MD 01/09/20 01/08/21  Tommi Rumps, PA-C  methocarbamol (ROBAXIN) 500 MG tablet 1 or 2 every 6 hours as needed for muscle spasms. 01/09/20   Tommi Rumps, PA-C  naloxone [facility]) nasal spray 4 mg/0.1 mL Administer 1 spray as needed into one nostril in the case of decreased breathing and responsiveness thought to be due to narcotics overdose.  Call 911 and repeat dose every 2 to 3 minutes in alternating nostrils until EMS arrives. 04/05/19   Phineas Semen, MD  oxyCODONE-acetaminophen (PERCOCET) 5-325 MG tablet Take 1 tablet by mouth every 6 (six) hours as needed  for severe pain. 01/09/20   Tommi Rumps, PA-C  sulfamethoxazole-trimethoprim (BACTRIM DS) 800-160 MG tablet Take 1 tablet by mouth 2 (two) times daily. 01/24/20   Triplett, Kasandra Knudsen, FNP    Allergies Hydrocodone  History reviewed. No pertinent family history.  Social History Social History   Tobacco Use  . Smoking status: Heavy Tobacco Smoker    Packs/day: 1.00    Types: Cigarettes  . Smokeless tobacco: Never Used  Substance Use Topics  . Alcohol use: Yes    Comment: occ  . Drug use: Yes    Types: Marijuana    Comment: herion    Review of Systems  Review of Systems  Unable to perform ROS: Mental status change     ____________________________________________  PHYSICAL EXAM:      VITAL SIGNS: ED Triage Vitals  Enc Vitals Group     BP 04/18/20 2212 103/69     Pulse Rate 04/18/20 2212 89     Resp 04/18/20 2212 16     Temp 04/18/20 2212 97.6 F (36.4 C)     Temp Source 04/18/20 2212 Oral     SpO2 04/18/20 2212 97 %     Weight 04/18/20 2213 180 lb (81.6 kg)     Height 04/18/20 2213 6' (1.829 m)     Head Circumference --      Peak Flow --      Pain Score 04/18/20 2212 0     Pain Loc --  Pain Edu? --      Excl. in GC? --      Physical Exam    ____________________________________________   LABS (all labs ordered are listed, but only abnormal results are displayed)  Labs Reviewed  CBC WITH DIFFERENTIAL/PLATELET - Abnormal; Notable for the following components:      Result Value   Hemoglobin 12.1 (*)    MCV 75.5 (*)    MCH 22.3 (*)    MCHC 29.6 (*)    RDW 19.3 (*)    Abs Immature Granulocytes 0.37 (*)    All other components within normal limits  COMPREHENSIVE METABOLIC PANEL - Abnormal; Notable for the following components:   Glucose, Bld 279 (*)    BUN 22 (*)    Calcium 8.4 (*)    AST 51 (*)    ALT 53 (*)    All other components within normal limits  ACETAMINOPHEN LEVEL - Abnormal; Notable for the following components:   Acetaminophen  (Tylenol), Serum <10 (*)    All other components within normal limits  SALICYLATE LEVEL - Abnormal; Notable for the following components:   Salicylate Lvl <7.0 (*)    All other components within normal limits  ETHANOL    ____________________________________________  EKG:  ________________________________________  RADIOLOGY All imaging, including plain films, CT scans, and ultrasounds, independently reviewed by me, and interpretations confirmed via formal radiology reads.  ED MD interpretation:   CT Head: Pending CT C Spine: Pending  Official radiology report(s): CT Head Wo Contrast  Result Date: 04/18/2020 CLINICAL DATA:  39 year old male with head trauma. EXAM: CT HEAD WITHOUT CONTRAST CT CERVICAL SPINE WITHOUT CONTRAST TECHNIQUE: Multidetector CT imaging of the head and cervical spine was performed following the standard protocol without intravenous contrast. Multiplanar CT image reconstructions of the cervical spine were also generated. COMPARISON:  Head CT dated 08/25/2017. FINDINGS: CT HEAD FINDINGS Brain: The ventricles and sulci appropriate size for patient's age. The gray-white matter discrimination is preserved. There is no acute intracranial hemorrhage. No mass effect or midline shift. Mild prominence of the posterior fossa CSF space may represent an arachnoid cyst versus dilated cisterna magna. Vascular: No hyperdense vessel or unexpected calcification. Skull: Normal. Negative for fracture or focal lesion. Sinuses/Orbits: Mild mucoperiosteal thickening of paranasal sinuses. No air-fluid level. Mild left mastoid effusion. Bilateral external auditory canal cerumen noted. Other: None CT CERVICAL SPINE FINDINGS Alignment: There is straightening of normal cervical lordosis which may be positional or due to muscle spasm. Skull base and vertebrae: No acute fracture. No primary bone lesion or focal pathologic process. Soft tissues and spinal canal: No prevertebral fluid or swelling. No  visible canal hematoma. Disc levels: No acute findings. No significant degenerative changes. Upper chest: Negative. Other: None IMPRESSION: 1. No acute intracranial pathology. 2. No acute/traumatic cervical spine pathology. Electronically Signed   By: Elgie Collard M.D.   On: 04/18/2020 23:18   CT Cervical Spine Wo Contrast  Result Date: 04/18/2020 CLINICAL DATA:  39 year old male with head trauma. EXAM: CT HEAD WITHOUT CONTRAST CT CERVICAL SPINE WITHOUT CONTRAST TECHNIQUE: Multidetector CT imaging of the head and cervical spine was performed following the standard protocol without intravenous contrast. Multiplanar CT image reconstructions of the cervical spine were also generated. COMPARISON:  Head CT dated 08/25/2017. FINDINGS: CT HEAD FINDINGS Brain: The ventricles and sulci appropriate size for patient's age. The gray-white matter discrimination is preserved. There is no acute intracranial hemorrhage. No mass effect or midline shift. Mild prominence of the posterior fossa CSF space may  represent an arachnoid cyst versus dilated cisterna magna. Vascular: No hyperdense vessel or unexpected calcification. Skull: Normal. Negative for fracture or focal lesion. Sinuses/Orbits: Mild mucoperiosteal thickening of paranasal sinuses. No air-fluid level. Mild left mastoid effusion. Bilateral external auditory canal cerumen noted. Other: None CT CERVICAL SPINE FINDINGS Alignment: There is straightening of normal cervical lordosis which may be positional or due to muscle spasm. Skull base and vertebrae: No acute fracture. No primary bone lesion or focal pathologic process. Soft tissues and spinal canal: No prevertebral fluid or swelling. No visible canal hematoma. Disc levels: No acute findings. No significant degenerative changes. Upper chest: Negative. Other: None IMPRESSION: 1. No acute intracranial pathology. 2. No acute/traumatic cervical spine pathology. Electronically Signed   By: Elgie Collard M.D.   On:  04/18/2020 23:18    ____________________________________________  PROCEDURES   Procedure(s) performed (including Critical Care):  Procedures  ____________________________________________  INITIAL IMPRESSION / MDM / ASSESSMENT AND PLAN / ED COURSE  As part of my medical decision making, I reviewed the following data within the electronic MEDICAL RECORD NUMBER Nursing notes reviewed and incorporated, Old chart reviewed, Notes from prior ED visits, and [place] Controlled Substance Database       *ZEREK LITSEY was evaluated in Emergency Department on 04/19/2020 for the symptoms described in the history of present illness. He was evaluated in the context of the global COVID-19 pandemic, which necessitated consideration that the patient might be at risk for infection with the SARS-CoV-2 virus that causes COVID-19. Institutional protocols and algorithms that pertain to the evaluation of patients at risk for COVID-19 are in a state of rapid change based on information released by regulatory bodies including the CDC and federal and state organizations. These policies and algorithms were followed during the patient's care in the ED.  Some ED evaluations and interventions may be delayed as a result of limited staffing during the pandemic.*     Medical Decision Making: 39 year old male here with accidental overdose.  He appears drowsy but is protecting airway at this time.  He has a history of prior accidental opiate overdoses and on my interview, denies any intentional self-harm.  Per patient's mother, patient did fall at the time of overdose so will check a CT head and C-spine.  Tox labs sent.  Plan to monitor until sober, reassess.  If he continues to deny any SI, HI, or psychiatric concerns, can likely discharge with new prescription for Narcan.  ____________________________________________  FINAL CLINICAL IMPRESSION(S) / ED DIAGNOSES  Final diagnoses:  Accidental overdose of heroin, initial  encounter [facility])     MEDICATIONS GIVEN DURING THIS VISIT:  Medications - No data to display   ED Discharge Orders    None       Note:  This document was prepared using Dragon voice recognition software and may include unintentional dictation errors.   Shaune Pollack, MD 04/19/20 1231

## 2020-04-18 NOTE — ED Notes (Signed)
Patient transported to CT 

## 2020-04-18 NOTE — ED Triage Notes (Signed)
PT to ED via EMS from home, EMS called by pt family d/t overdose requiring narcan which was given by the family. Upon arrival pt responds to loud voice or painful stimuli, refusing to answer any questions. Per Medic, mother of pt is on the way with intent to IVC pt.

## 2020-04-18 NOTE — ED Notes (Signed)
Updated pt's mother with pt permission  

## 2020-04-18 NOTE — ED Notes (Signed)
Pt responding to questions at this time, denies intent to harm himself Pt is hard of hearing

## 2020-04-19 NOTE — Discharge Instructions (Addendum)
You have been seen in the Emergency Department (ED)  today for a psychiatric complaint.  You have been evaluated by psychiatry and we believe you are safe to be discharged from the hospital.   ° °Please return to the Emergency Department (ED)  immediately if you have ANY thoughts of hurting yourself or anyone else, so that we may help you. ° °Please avoid alcohol and drug use. ° °Follow up with your doctor and/or therapist as soon as possible regarding today's ED  visit.  ° °You may call crisis hotline for [place] at [phone]. ° °

## 2020-04-19 NOTE — ED Notes (Signed)
Pt resting in bed, has turned over a couple times. Even respirations

## 2020-04-19 NOTE — ED Notes (Signed)
Pt awake, sitting up in bed with lights on. Given water.

## 2020-04-19 NOTE — ED Notes (Signed)
Pt ambulatory to restroom

## 2020-04-19 NOTE — ED Provider Notes (Signed)
Patient is clinically sober, tolerating p.o., ambulating with no difficulty.  Observed for greater than 5 hours post Narcan with no need to redose.  Denies any suicidal thoughts.  At this time he is stable for discharge home. Counseling provided.   Nita Sickle, MD 04/19/20 [phone]

## 2020-11-04 DEATH — deceased

## 2022-04-18 IMAGING — CT CT CERVICAL SPINE W/O CM
3 of 4 series · 12 of 33 positions shown, 14 images · non-contrast
Comparison: Head CT dated 08/25/2017.

CLINICAL DATA: 38-year-old male with head trauma.

EXAM:
CT HEAD WITHOUT CONTRAST
CT CERVICAL SPINE WITHOUT CONTRAST
TECHNIQUE: Multidetector CT imaging of the head and cervical spine was
performed following the standard protocol without intravenous
contrast. Multiplanar CT image reconstructions of the cervical spine
were also generated.

[Series 4: sagittal bone · sagittal · 0.25mm/px · 5 of 54 slices shown, 6 images]
[im 18/54  bone]
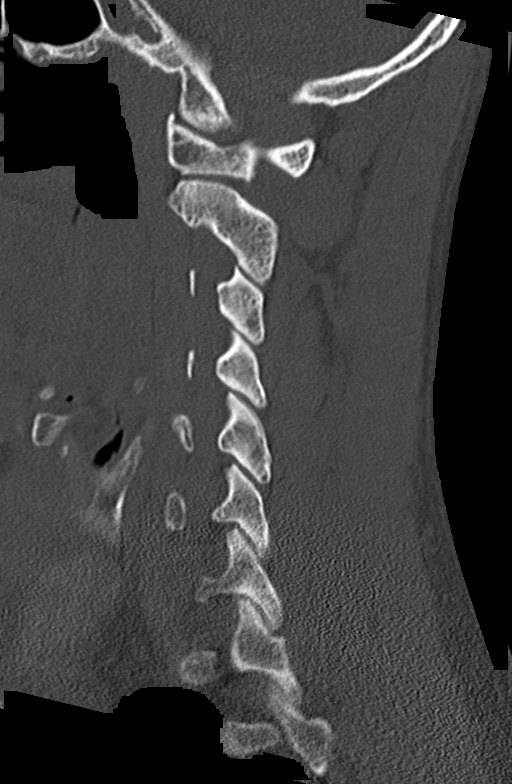
[im 23/54  bone]
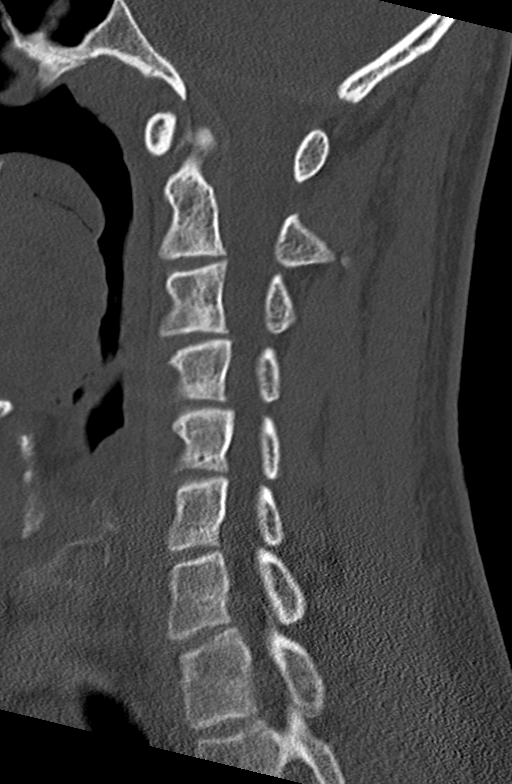
[im 27/54  soft-tissue]
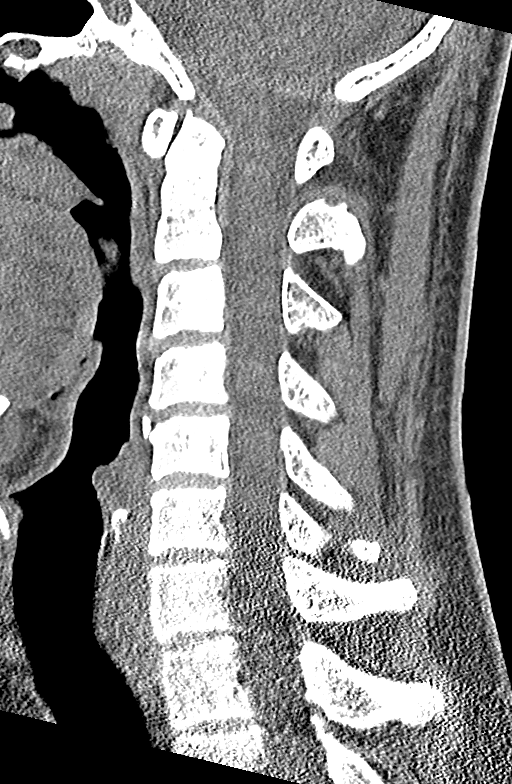
[im 27/54  bone]
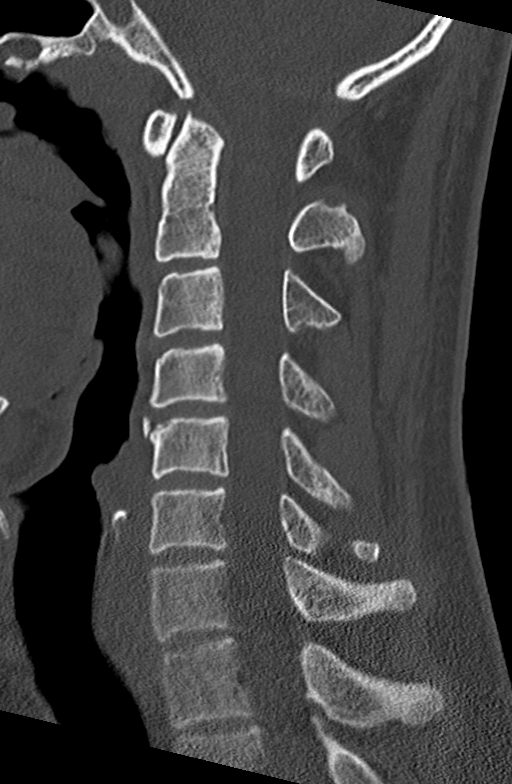
[im 31/54  bone]
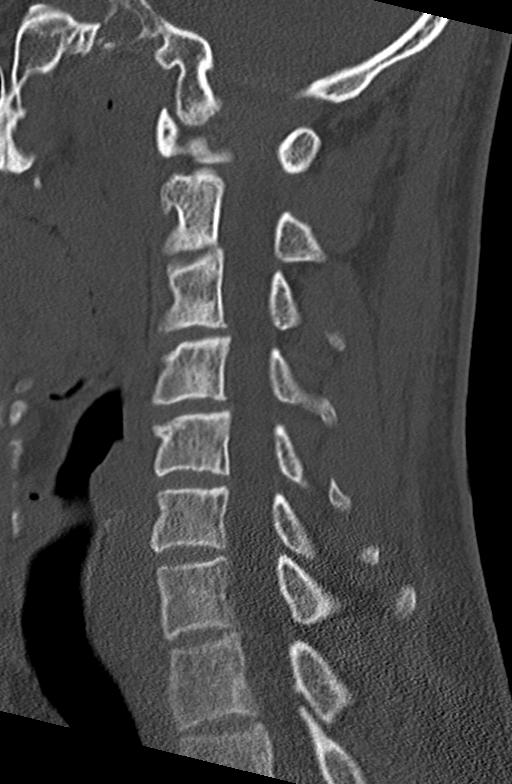
[im 36/54  bone]
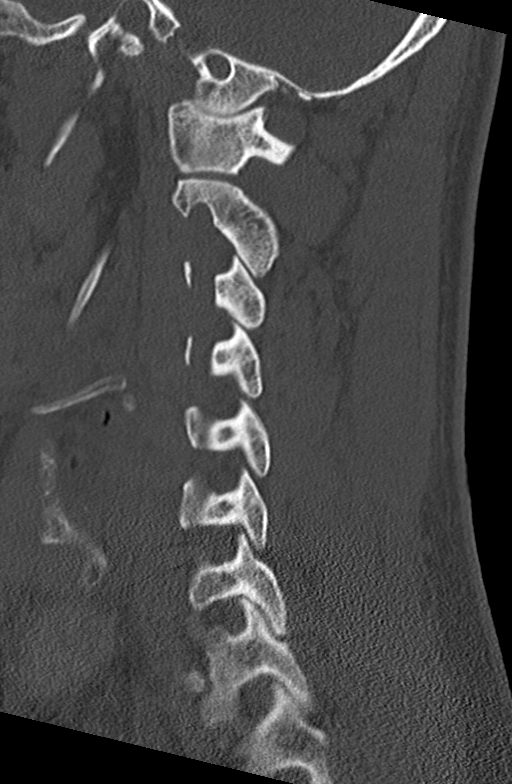

[Series 5: coronal bone · coronal · 0.24mm/px · 3 of 44 slices shown]
[im 9/44  bone]
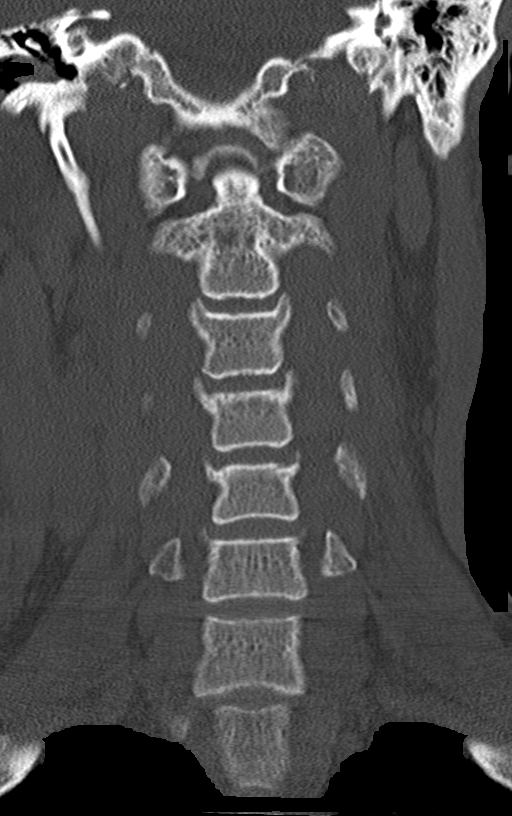
[im 18/44  bone]
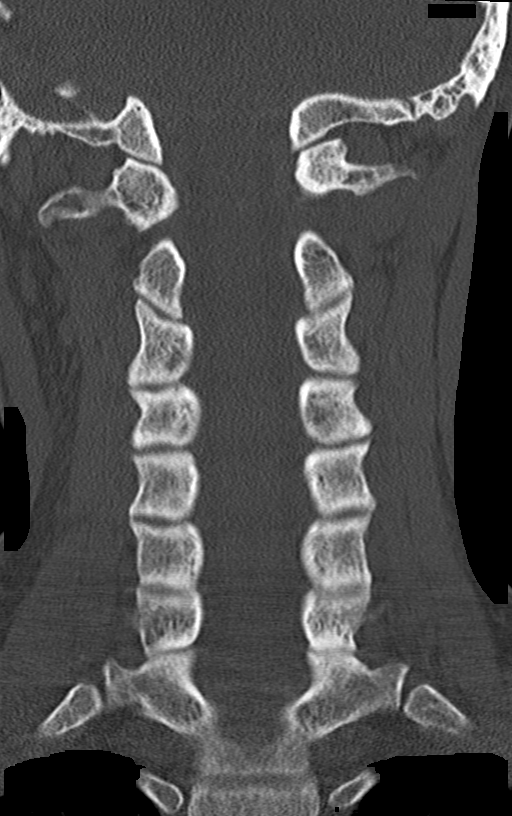
[im 26/44  bone]
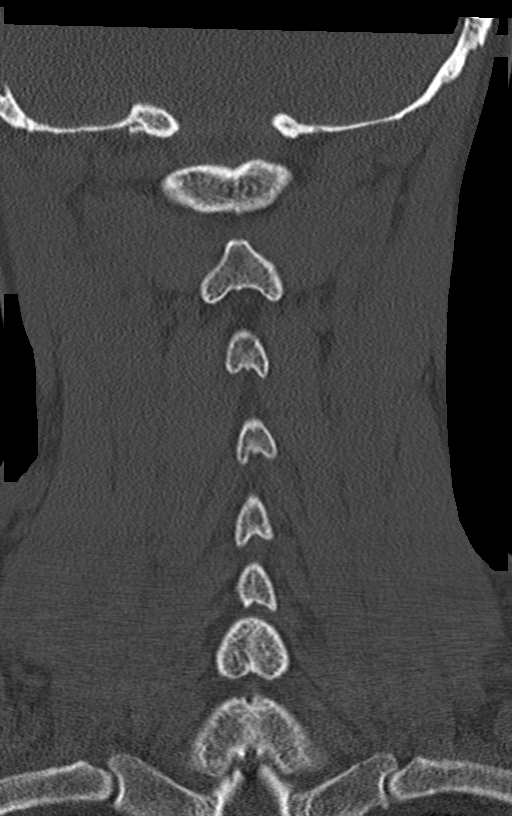

[Series 6: orthogonal bone · axial · 0.24mm/px · z∈[-346,-222]mm · 4 of 98 slices shown, 5 images]
[im 17/98  soft-tissue]
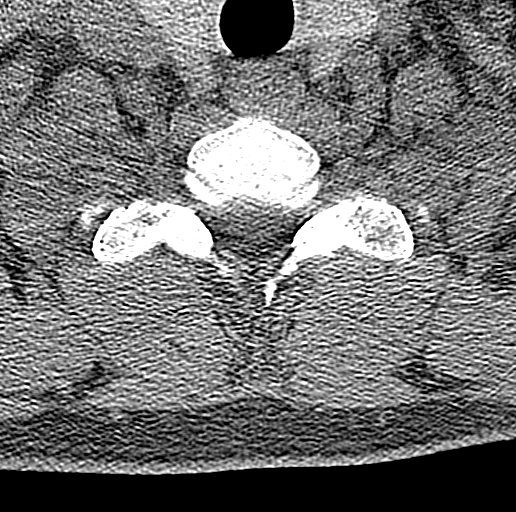
[im 17/98  bone]
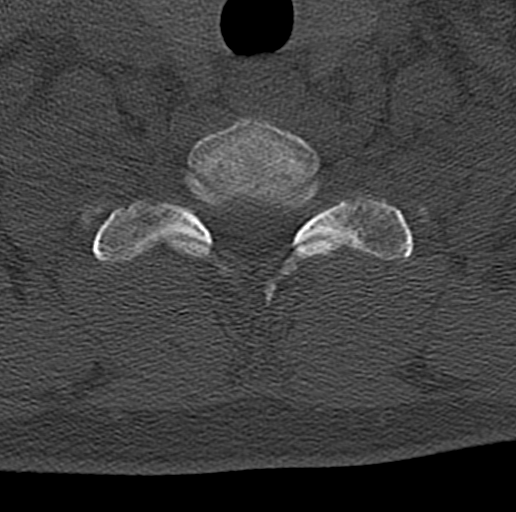
[im 33/98  bone]
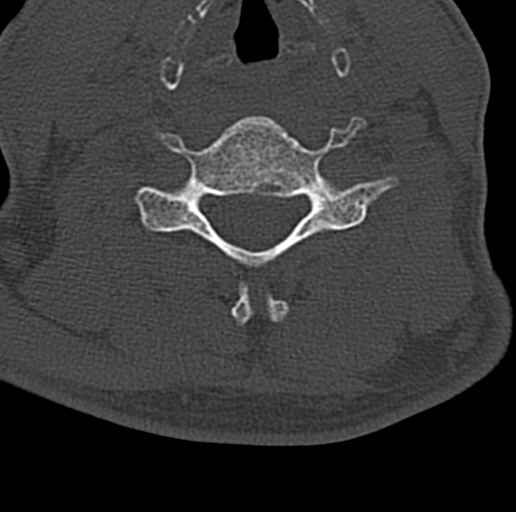
[im 65/98  bone]
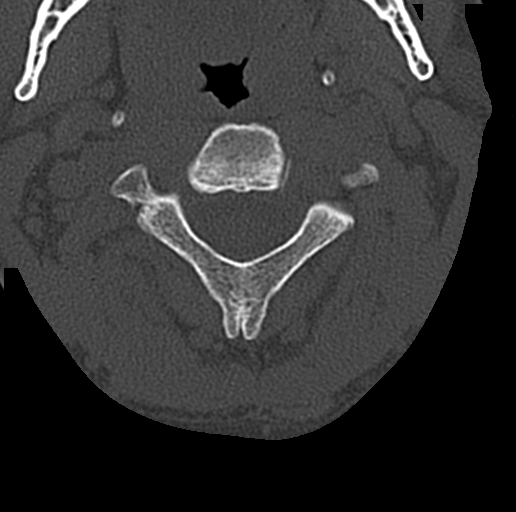
[im 81/98  bone]
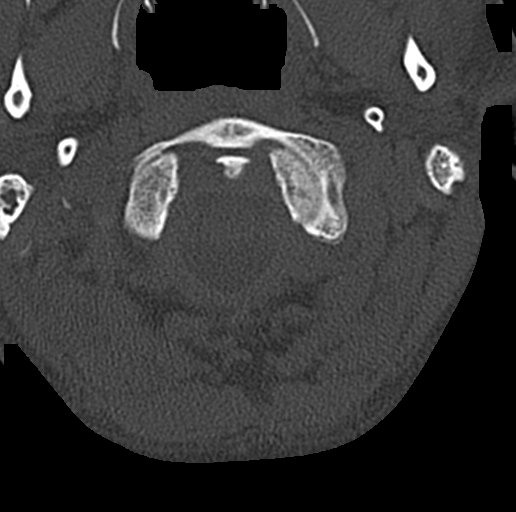

[12 of 33 positions shown; findings below may reference images not displayed]

FINDINGS: CT HEAD FINDINGS

Brain: The ventricles and sulci appropriate size for patient's age.
The gray-white matter discrimination is preserved. There is no acute
intracranial hemorrhage. No mass effect or midline shift. Mild
prominence of the posterior fossa CSF space may represent an
arachnoid cyst versus dilated cisterna magna.

Vascular: No hyperdense vessel or unexpected calcification.

Skull: Normal. Negative for fracture or focal lesion.

Sinuses/Orbits: Mild mucoperiosteal thickening of paranasal sinuses.
No air-fluid level. Mild left mastoid effusion. Bilateral external
auditory canal cerumen noted.

Other: None

CT CERVICAL SPINE FINDINGS

Alignment: There is straightening of normal cervical lordosis which
may be positional or due to muscle spasm.

Skull base and vertebrae: No acute fracture. No primary bone lesion
or focal pathologic process.

Soft tissues and spinal canal: No prevertebral fluid or swelling. No
visible canal hematoma.

Disc levels: No acute findings. No significant degenerative changes.

Upper chest: Negative.

Other: None
IMPRESSION: 1. No acute intracranial pathology.
2. No acute/traumatic cervical spine pathology.
